# Patient Record
Sex: Female | Born: 1960
Health system: Southern US, Community
[De-identification: ages and names within clinical notes are randomized; demographics above are authoritative.]

## PROBLEM LIST (undated history)

## (undated) DIAGNOSIS — T7840XA Allergy, unspecified, initial encounter: Secondary | ICD-10-CM

## (undated) DIAGNOSIS — I8390 Asymptomatic varicose veins of unspecified lower extremity: Secondary | ICD-10-CM

## (undated) HISTORY — DX: Asymptomatic varicose veins of unspecified lower extremity: I83.90

## (undated) HISTORY — DX: Allergy, unspecified, initial encounter: T78.40XA

---

## 1998-08-25 ENCOUNTER — Other Ambulatory Visit: Admission: RE | Admit: 1998-08-25 | Discharge: 1998-08-25 | Payer: Self-pay | Admitting: Obstetrics & Gynecology

## 1999-09-05 ENCOUNTER — Other Ambulatory Visit: Admission: RE | Admit: 1999-09-05 | Discharge: 1999-09-05 | Payer: Self-pay | Admitting: Gynecology

## 2000-05-15 ENCOUNTER — Other Ambulatory Visit: Admission: RE | Admit: 2000-05-15 | Discharge: 2000-05-15 | Payer: Self-pay | Admitting: Obstetrics & Gynecology

## 2000-10-25 ENCOUNTER — Inpatient Hospital Stay (HOSPITAL_COMMUNITY): Admission: AD | Admit: 2000-10-25 | Discharge: 2000-10-25 | Payer: Self-pay | Admitting: Obstetrics & Gynecology

## 2000-11-28 ENCOUNTER — Inpatient Hospital Stay (HOSPITAL_COMMUNITY): Admission: AD | Admit: 2000-11-28 | Discharge: 2000-12-01 | Payer: Self-pay | Admitting: Obstetrics & Gynecology

## 2001-01-16 ENCOUNTER — Other Ambulatory Visit: Admission: RE | Admit: 2001-01-16 | Discharge: 2001-01-16 | Payer: Self-pay | Admitting: Obstetrics & Gynecology

## 2002-02-26 ENCOUNTER — Other Ambulatory Visit: Admission: RE | Admit: 2002-02-26 | Discharge: 2002-02-26 | Payer: Self-pay | Admitting: Obstetrics & Gynecology

## 2003-04-08 ENCOUNTER — Other Ambulatory Visit: Admission: RE | Admit: 2003-04-08 | Discharge: 2003-04-08 | Payer: Self-pay | Admitting: Obstetrics & Gynecology

## 2003-07-11 HISTORY — PX: HERNIA REPAIR: SHX51

## 2003-07-11 HISTORY — PX: BREAST LUMPECTOMY: SHX2

## 2004-05-04 ENCOUNTER — Other Ambulatory Visit: Admission: RE | Admit: 2004-05-04 | Discharge: 2004-05-04 | Payer: Self-pay | Admitting: Obstetrics & Gynecology

## 2004-12-14 ENCOUNTER — Ambulatory Visit (HOSPITAL_COMMUNITY): Admission: RE | Admit: 2004-12-14 | Discharge: 2004-12-14 | Payer: Self-pay

## 2004-12-14 ENCOUNTER — Ambulatory Visit (HOSPITAL_BASED_OUTPATIENT_CLINIC_OR_DEPARTMENT_OTHER): Admission: RE | Admit: 2004-12-14 | Discharge: 2004-12-14 | Payer: Self-pay

## 2005-07-06 ENCOUNTER — Other Ambulatory Visit: Admission: RE | Admit: 2005-07-06 | Discharge: 2005-07-06 | Payer: Self-pay | Admitting: Obstetrics & Gynecology

## 2006-01-16 HISTORY — PX: ENDOVENOUS ABLATION SAPHENOUS VEIN W/ LASER: SUR449

## 2007-06-05 ENCOUNTER — Encounter: Admission: RE | Admit: 2007-06-05 | Discharge: 2007-06-05 | Payer: Self-pay | Admitting: Obstetrics & Gynecology

## 2007-06-25 ENCOUNTER — Ambulatory Visit (HOSPITAL_BASED_OUTPATIENT_CLINIC_OR_DEPARTMENT_OTHER): Admission: RE | Admit: 2007-06-25 | Discharge: 2007-06-25 | Payer: Self-pay | Admitting: Surgery

## 2007-06-25 ENCOUNTER — Encounter (INDEPENDENT_AMBULATORY_CARE_PROVIDER_SITE_OTHER): Payer: Self-pay | Admitting: Surgery

## 2007-08-08 ENCOUNTER — Ambulatory Visit: Payer: Self-pay | Admitting: Vascular Surgery

## 2007-08-22 ENCOUNTER — Ambulatory Visit: Payer: Self-pay | Admitting: Vascular Surgery

## 2008-06-09 ENCOUNTER — Encounter: Admission: RE | Admit: 2008-06-09 | Discharge: 2008-06-09 | Payer: Self-pay | Admitting: Obstetrics & Gynecology

## 2009-05-11 ENCOUNTER — Encounter: Admission: RE | Admit: 2009-05-11 | Discharge: 2009-05-11 | Payer: Self-pay | Admitting: Obstetrics & Gynecology

## 2009-05-11 ENCOUNTER — Encounter (INDEPENDENT_AMBULATORY_CARE_PROVIDER_SITE_OTHER): Payer: Self-pay | Admitting: Obstetrics & Gynecology

## 2009-06-11 ENCOUNTER — Encounter: Admission: RE | Admit: 2009-06-11 | Discharge: 2009-06-11 | Payer: Self-pay | Admitting: Obstetrics & Gynecology

## 2009-10-04 ENCOUNTER — Ambulatory Visit: Payer: Self-pay | Admitting: Vascular Surgery

## 2010-07-20 ENCOUNTER — Encounter
Admission: RE | Admit: 2010-07-20 | Discharge: 2010-07-20 | Payer: Self-pay | Source: Home / Self Care | Attending: Obstetrics & Gynecology | Admitting: Obstetrics & Gynecology

## 2010-09-29 ENCOUNTER — Ambulatory Visit: Payer: Self-pay

## 2010-11-22 NOTE — Op Note (Signed)
Erica Ray, Erica Ray                 ACCOUNT NO.:  [id number]   MEDICAL RECORD NO.:  [id number]          PATIENT TYPE:  AMB   LOCATION:  DSC                          FACILITY:  [facility]   PHYSICIAN:  Currie Paris, M.D.DATE OF BIRTH:  1960-09-12   DATE OF PROCEDURE:  06/25/2007  DATE OF DISCHARGE:                               OPERATIVE REPORT   OFFICE MEDICAL RECORD NUMBER:  [id number].   PREOPERATIVE DIAGNOSIS:  Left breast mass.   POSTOPERATIVE DIAGNOSIS:  Left breast mass.   OPERATION:  Excisional biopsy left breast mass.   SURGEON:  Currie Paris, M.D.   ANESTHESIA:  MAC.   CLINICAL HISTORY:  This is a 50 year old lady with a palpable  abnormality in left breast midlateral position, about a 3 cm area.  Mammogram ultrasound was unremarkable, but this was clearly a palpable  abnormality compared to anyplace else in her breasts, although she has  very dense breasts.  Clinically, it was felt to represent benign  fibrocystic-type changes, but the patient requested a surgical biopsy  because of the fact that she could palpate something, and we agreed, as  it was by physical somewhat abnormal.  In the interval since I saw her  in the office and today, the mass remained the same location, although  she thought it was a little bit more rounded and less oblong than when I  had seen her a couple of weeks ago.   DESCRIPTION OF PROCEDURE:  The patient was seen in the holding area, and  she had no further questions.  We confirmed that the left side was the  operative side, and I initialed the left breast area.   The patient was taken into the operating room, and prior to being given  any IV sedation she and I identified the mass, and it was marked.  It  was clearly a rounded area of very dense breast tissue just lateral to  the edge of the areolar margin at the 3 o'clock position.  Again, it  felt to me to be a particularly dense area of fibrocystic change.   The patient was  then given IV sedation, and the left breast was prepped  and draped.  The time-out was performed.   A combination of 1% Xylocaine with epinephrine and 0.5% plain Marcaine  was mixed equally and used for local.  I infiltrated the area around the  mass and the overlying skin.  I made a transverse incision directly over  the area.  I divided the subcutaneous tissue until I could see breast  tissue and palpate this area that felt abnormal.  I put a 3-0 Vicryl  suture through this and used this for traction.  I then used cutting  current of the cautery to excise this area of tissue, and it all  appeared to be very dense breast tissue, but clearly more nodular within  the tissue I took out, with what felt like some more typical fibrotic  tissue around the more dense area.   Once this was out, I carefully palpated the edges of the biopsy cavity  to be sure there was no other residual palpable abnormality, and  everything appeared to be just dense breast tissue.  I also made sure  everything was completely dry  using cautery.  I then closed the breast in layers with some 3-0 Vicryl  followed by some 4-0 Monocryl subcuticular and Dermabond.   The patient tolerated the procedure well, and there were no  complications.  All counts were correct.      Currie Paris, M.D.  Electronically Signed     CJS/MEDQ  D:  06/25/2007  T:  06/26/2007  Job:  [id number]   cc:   Freddy Finner, M.D.

## 2010-11-25 NOTE — Op Note (Signed)
Erica Ray, Erica Ray                 ACCOUNT NO.:  [id number]   MEDICAL RECORD NO.:  [id number]          PATIENT TYPE:  AMB   LOCATION:  [facility]                         FACILITY:  [facility]   PHYSICIAN:  Lorre Munroe., M.D.DATE OF BIRTH:  06-11-1961   DATE OF PROCEDURE:  12/14/2004  DATE OF DISCHARGE:                                 OPERATIVE REPORT   PREOPERATIVE DIAGNOSES:  1.  Indirect right inguinal hernia.  2.  Varicosities of the right long saphenous vein.   OPERATION:  1.  Repair of right inguinal hernia.  2.  Ligation of the long saphenous vein and tributaries at the      saphenofemoral junction.   SURGEON:  Lebron Conners, M.D.   ANESTHESIA:  General and local.   DESCRIPTION OF PROCEDURE:  After the patient was monitored and had general  anesthesia and routine preparation and draping of the right groin region, I  made a short oblique incision beginning at the pubic tubercle and going  laterally right over the spot where the hernia was palpable when she stood.  I dissected down through subcutaneous tissues and divided several small  venous tributaries heading toward the saphenofemoral junction. I then  identified the superficial inguinal ring, which was very small but I saw  some slightly expanded tissue protruding through it. I opened the  aponeurosis of the external oblique into the superficial ring and dissected  up the round ligament. There was an obvious hernia within the round  ligament. I suture ligated the round ligament with 3-0 Vicryl at the pubic  tubercle and then dissected it up to the deep ring. I also ligated it  proximally. I reduced the hernia and round ligament through the deep  inguinal ring and plugged the ring with a small plug of polypropylene mesh  and sutured that in with 2-0 Vicryl stitch. I then made a patch of  polypropylene mesh to cover the inguinal floor from the pubic tubercle to  healthy tissues lateral to the deep ring and I sewed that in  with running 2-  0 Prolene suture in the superficial fascia of the internal oblique medially  and in the shelving edge of the inguinal ligament laterally and inferiorly.  I felt the hernia was securely repaired. I sewed the external oblique back  together with running 3-0 Vicryl. I then dissected the downward toward the  saphenofemoral junction taking the dissection just medial to the femoral  arterial pulse. I found several large tributaries of the long saphenous vein  at the saphenofemoral junction and I ligated all of those with 3-0 Vicryl. I  dissected out the saphenofemoral junction and saw the long saphenous vein  going deep into the fossa ovalis. I divided it at that point and suture  ligated it toward the femoral vein with 3-0 Vicryl and then dissected it  distally and passed a couple more small tributaries which I ligated with 3-0  Vicryl and  then I ligated it distally with 3-0 Vicryl. The proximal part of the long  saphenous vein was quite varicose. It looked more normal distally.  Hemostasis was good. I restored the subcutaneous tissues with 3-0 Vicryl and  closed the skin with intracuticular 4-0 Vicryl and Steri-Strips.       WB/MEDQ  D:  12/14/2004  T:  12/14/2004  Job:  [id number]   cc:   Valetta Mole. Cato Mulligan, M.D. [facility]   Freddy Finner, M.D.  Fax: [phone]   Jene Every, M.D.  [address]  [place]  [place] [address]  Fax: [phone]

## 2011-04-14 LAB — POCT HEMOGLOBIN-HEMACUE: Hemoglobin: 13.7

## 2011-07-26 ENCOUNTER — Ambulatory Visit (INDEPENDENT_AMBULATORY_CARE_PROVIDER_SITE_OTHER): Payer: Self-pay | Admitting: *Deleted

## 2011-07-26 ENCOUNTER — Encounter: Payer: Self-pay | Admitting: *Deleted

## 2011-07-26 DIAGNOSIS — I781 Nevus, non-neoplastic: Secondary | ICD-10-CM

## 2011-07-26 NOTE — Progress Notes (Signed)
X=.3% Sotradecol administered with a 27g butterfly.  Patient received a total of 6cc foam.  Her legs are in great shape but treated the areas of concern. Hoping for results she will be pleased with. Will follow prn.  Photos: yes  Compression stockings applied: yes

## 2011-07-27 ENCOUNTER — Encounter: Payer: Self-pay | Admitting: Vascular Surgery

## 2011-10-04 ENCOUNTER — Ambulatory Visit: Payer: Self-pay | Admitting: *Deleted

## 2013-06-24 ENCOUNTER — Other Ambulatory Visit: Payer: Self-pay | Admitting: Obstetrics and Gynecology

## 2013-06-24 DIAGNOSIS — Z1231 Encounter for screening mammogram for malignant neoplasm of breast: Secondary | ICD-10-CM

## 2013-07-07 ENCOUNTER — Ambulatory Visit
Admission: RE | Admit: 2013-07-07 | Discharge: 2013-07-07 | Disposition: A | Discharge status: Home / Self Care | Payer: No Typology Code available for payment source | Source: Ambulatory Visit | Attending: Obstetrics and Gynecology | Admitting: Obstetrics and Gynecology

## 2013-07-07 DIAGNOSIS — Z1231 Encounter for screening mammogram for malignant neoplasm of breast: Secondary | ICD-10-CM

## 2013-08-25 ENCOUNTER — Encounter: Payer: Self-pay | Admitting: *Deleted

## 2013-08-27 ENCOUNTER — Ambulatory Visit: Payer: No Typology Code available for payment source | Admitting: *Deleted

## 2013-08-28 ENCOUNTER — Encounter: Payer: Self-pay | Admitting: *Deleted

## 2013-08-29 ENCOUNTER — Encounter: Payer: Self-pay | Admitting: Vascular Surgery

## 2013-08-29 ENCOUNTER — Ambulatory Visit (INDEPENDENT_AMBULATORY_CARE_PROVIDER_SITE_OTHER): Payer: No Typology Code available for payment source | Admitting: *Deleted

## 2013-08-29 DIAGNOSIS — I781 Nevus, non-neoplastic: Secondary | ICD-10-CM

## 2013-08-29 NOTE — Progress Notes (Signed)
X=.3% Sotradecol administered with a 27g butterfly.  Patient received a total of 10cc.  Her legs have come a long way. She wants to stay on top of her veins so injected some bulges and some spiders. CL in a few months would be good to get the tiny red vessels. Tol well. Follow prn.  Photos: no  Compression stockings applied: yes

## 2013-09-02 ENCOUNTER — Ambulatory Visit (INDEPENDENT_AMBULATORY_CARE_PROVIDER_SITE_OTHER): Payer: No Typology Code available for payment source | Admitting: *Deleted

## 2013-09-02 DIAGNOSIS — I781 Nevus, non-neoplastic: Secondary | ICD-10-CM

## 2013-09-02 NOTE — Progress Notes (Signed)
Patient was here last week and I forgot to lase a spot on her cheek. Also checked on a vein in her right inner thigh. Pt being compliant with her stockings. Following prn.

## 2013-09-25 ENCOUNTER — Encounter: Payer: Self-pay | Admitting: *Deleted

## 2013-09-26 ENCOUNTER — Ambulatory Visit (INDEPENDENT_AMBULATORY_CARE_PROVIDER_SITE_OTHER): Payer: Self-pay | Admitting: *Deleted

## 2013-09-26 DIAGNOSIS — I781 Nevus, non-neoplastic: Secondary | ICD-10-CM

## 2013-09-26 DIAGNOSIS — I83893 Varicose veins of bilateral lower extremities with other complications: Secondary | ICD-10-CM

## 2013-09-26 NOTE — Progress Notes (Signed)
The patient came in today for me to look at the veins in particular on the back of her right calf but also on her left calf in the back. She complains of heaviness in both legs as the day goes on. She has had previous laser of the R GSV with phlebectomies by Dr. Arbie CookeyEarly. She has been wearing thigh high graduated compression stockings since her laser sclero treatment on 08/29/13 so that is when we started her three months of conservative therapy. She does elevate at night when she is able and tries Ibuprofen for pain prn. She is an active mother of 4 children and on her feet all day. The heaviness, pain and bulging worsens as the day goes on. Dr. Imogene Burnhen saw her today briefly and ordered a bilateral reflux study and new vv MD visit asap.

## 2013-09-26 NOTE — Addendum Note (Signed)
Addended by: Adria DillELDRIDGE-LEWIS, Brunette Lavalle L on: 09/26/2013 07:29 PM   Modules accepted: Orders

## 2013-11-14 ENCOUNTER — Encounter: Payer: Self-pay | Admitting: Surgery

## 2013-11-17 ENCOUNTER — Ambulatory Visit (HOSPITAL_COMMUNITY)
Admission: RE | Admit: 2013-11-17 | Discharge: 2013-11-17 | Disposition: A | Discharge status: Home / Self Care | Payer: No Typology Code available for payment source | Source: Ambulatory Visit | Attending: Vascular Surgery | Admitting: Vascular Surgery

## 2013-11-17 ENCOUNTER — Ambulatory Visit (INDEPENDENT_AMBULATORY_CARE_PROVIDER_SITE_OTHER): Payer: No Typology Code available for payment source | Admitting: Surgery

## 2013-11-17 ENCOUNTER — Encounter: Payer: Self-pay | Admitting: Surgery

## 2013-11-17 VITALS — BP 139/70 | HR 67 | Resp 18 | Ht 64.0 in | Wt 111.7 lb

## 2013-11-17 DIAGNOSIS — I83893 Varicose veins of bilateral lower extremities with other complications: Secondary | ICD-10-CM | POA: Insufficient documentation

## 2013-11-17 NOTE — Progress Notes (Signed)
Patient name: Erica PieriniSusan M Ray MRN: [id number] DOB: 27-Jun-1961 Sex: female     Chief Complaint  Patient presents with  . New Evaluation    c/o bilateral leg achiness and heaviness R>L, worse with prolonged standing     . Varicose Veins    HISTORY OF PRESENT ILLNESS: The patient is back today with further concerns regarding her bilateral lower extremities.  She has a history of undergoing endovenous laser closure of the right great saphenous vein and stab phlebectomy of secondary varicosities by Dr. early in 2007.  Prior to that she had also undergone tape procedure at the pain clinic.  She reports relatively new symptoms of heaviness and burning in both legs.  The right leg bothers her more so than the left.  She does have occasional swelling.  Her symptoms are alleviated with leg elevation.  She does wear compression stockings.  She has also undergone several sessions of sclerotherapy for symptomatic spider veins.  Past Medical History  Diagnosis Date  . Varicose veins   . Allergy     Past Surgical History  Procedure Laterality Date  . Endovenous ablation saphenous vein w/ laser Right 01-16-2006    Gretta Beganodd Early MD  . Cesarean section    . Breast lumpectomy Left 2005  . Hernia repair  2005    History   Social History  . Marital Status: Married    Spouse Name: N/A    Number of Children: N/A  . Years of Education: N/A   Occupational History  . Not on file.   Social History Main Topics  . Smoking status: Never Smoker   . Smokeless tobacco: Not on file  . Alcohol Use: No  . Drug Use: No  . Sexual Activity: Not on file   Other Topics Concern  . Not on file   Social History Narrative  . No narrative on file    History reviewed. No pertinent family history.  Allergies as of 11/17/2013 - Review Complete 11/17/2013  Allergen Reaction Noted  . Bactrim [sulfamethoxazole-tmp ds]  11/17/2013  . Penicillins  11/17/2013    No current outpatient prescriptions on file  prior to visit.   No current facility-administered medications on file prior to visit.     REVIEW OF SYSTEMS: Please see history of present illness, otherwise all systems negative  PHYSICAL EXAMINATION:   Vital signs are BP 139/70  Pulse 67  Resp 18  Ht 5\' 4"  (1.626 m)  Wt 111 lb 11.2 oz (50.667 kg)  BMI 19.16 kg/m2 General: The patient appears their stated age. HEENT:  No gross abnormalities Pulmonary:  Non labored breathing Musculoskeletal: There are no major deformities. Neurologic: No focal weakness or paresthesias are detected, Skin: There are no ulcer or rashes noted. Psychiatric: The patient has normal affect. Cardiovascular: Palpable pedal pulses.  Trace edema bilaterally.  Spider veins bilaterally   Diagnostic Studies I have ordered and reviewed her ultrasound.  There is deep vein reflux on the right.  There is a segment of the great saphenous vein with significant reflux.  Diameter measurements are between 0.2 and a 0.3 cm.  There is also significant reflux in the small saphenous vein with maximum diameter of 0.46 cm in the mid calf.  Multiple tributaries are appreciated around the small saphenous vein.  Assessment: Symptomatic venous insufficiency, bilateral Plan: I discussed with the patient that I feel most of her symptoms on the left leg are secondary to deep vein reflux.  No significant superficial system  reflux was noted.  I feel this would be best treated by compression stockings.  On the right leg however, she has residual reflux within the great saphenous vein with rather small diameters.  The small saphenous vein has significant reflux and refluxing tributaries.  I do feel she would be a candidate for small saphenous vein laser ablation and possible treatment of associated varicosities.  I will place the patient in 20-30 mm thigh-high compression stockings and have her come back for a repeat evaluation by Dr. early in 3 months  V. Charlena CrossWells Cleatus Gabriel IV, M.D. [facility] Office: [phone] Pager:  [phone]

## 2014-01-21 ENCOUNTER — Other Ambulatory Visit: Payer: Self-pay | Admitting: Gastroenterology

## 2014-02-16 ENCOUNTER — Encounter: Payer: Self-pay | Admitting: Vascular Surgery

## 2014-02-17 ENCOUNTER — Encounter: Payer: Self-pay | Admitting: Vascular Surgery

## 2014-02-17 ENCOUNTER — Ambulatory Visit (INDEPENDENT_AMBULATORY_CARE_PROVIDER_SITE_OTHER): Payer: No Typology Code available for payment source | Admitting: Vascular Surgery

## 2014-02-17 VITALS — BP 110/49 | HR 80 | Resp 18 | Ht 64.0 in | Wt 116.0 lb

## 2014-02-17 DIAGNOSIS — I83893 Varicose veins of bilateral lower extremities with other complications: Secondary | ICD-10-CM

## 2014-02-17 NOTE — Progress Notes (Signed)
Problems with Activities of Daily Living Secondary to Leg Pain  1. Mrs. Erica Ray states that activities that require prolonged standing (cooking, cleaning, shopping)  are very difficult due to leg pain.    2. Mrs. Erica Ray states when traveling (car or plane) is very difficult due to leg pain.    3. Mrs.  Erica Ray has had to discontinue running (for exercise) due to leg pain.     Failure of  Conservative Therapy:  1. Worn 20-30 mm Hg thigh high compression hose >3 months with no relief of symptoms.  2. Frequently elevates legs-no relief of symptoms  3. Taken Ibuprofen 600 Mg TID with no relief of symptoms.  Here today for continued discussion regarding her venous pathology. She's had good results from her prior treatment. She does have some scattered small varicose and reticular veins in her posterior calf was probably on the right. She does report some discomfort and burning over this area. I reviewed her most recent duplex from 11/17/2013 and also image these areas with SonoSite myself. Her small saphenous vein on the right does have some reflux but is not particularly dilated. Maximal diameter being 0.46 cm. She does have very large dilated popliteal and gastrocnemius veins and gross reflux throughout her deep system bilaterally.  A long discussion with the patient. I feel that her discomfort is related to deep venous hypertension. Ex-preemie only treatment option would be continued elevation and compression for deep venous reflux. She may receive some relief with the use of superficial reticular veins and small varicosities with the sclerotherapy. She will schedule this at her convenience in our office

## 2014-07-14 ENCOUNTER — Encounter: Payer: Self-pay | Admitting: *Deleted

## 2014-07-15 ENCOUNTER — Ambulatory Visit (INDEPENDENT_AMBULATORY_CARE_PROVIDER_SITE_OTHER): Payer: No Typology Code available for payment source | Admitting: *Deleted

## 2014-07-15 DIAGNOSIS — I781 Nevus, non-neoplastic: Secondary | ICD-10-CM

## 2014-07-15 NOTE — Progress Notes (Signed)
X=.3% Sotradecol administered with a 27g butterfly.  Patient received a total of 12cc.  Treated all areas of concern. Tol well and very easy access. Anticipate good results. Follow prn.  Photos: No.  Compression stockings applied: Yes.  and an ace around foot ankle of left leg.

## 2014-07-16 ENCOUNTER — Other Ambulatory Visit: Payer: Self-pay | Admitting: Obstetrics and Gynecology

## 2014-07-17 ENCOUNTER — Encounter: Payer: Self-pay | Admitting: *Deleted

## 2014-07-17 LAB — CYTOLOGY - PAP

## 2014-07-28 ENCOUNTER — Ambulatory Visit (INDEPENDENT_AMBULATORY_CARE_PROVIDER_SITE_OTHER): Payer: No Typology Code available for payment source | Admitting: *Deleted

## 2014-07-28 DIAGNOSIS — I781 Nevus, non-neoplastic: Secondary | ICD-10-CM

## 2014-07-28 NOTE — Progress Notes (Signed)
Sclero check: It will be two weeks since her treatment. She wanted to know if she needed more injections. I feel it's too soon to determine if the sclero didn't work. Area of concern feels firm therefore hopefully will disappear. I suggested that she give this more time to resolve. She agreed. Follow prn.

## 2015-03-20 IMAGING — MG STANDARD SCREENING - COMBO
8 series · 9 of 24 positions shown · non-contrast
Comparison: Previous exam(s).

CLINICAL DATA: Screening.

EXAM:
DIGITAL SCREENING BILATERAL MAMMOGRAM WITH CAD
DIGITAL BREAST TOMOSYNTHESIS
Digital breast tomosynthesis images are acquired in two projections.
These images are reviewed in combination with the digital mammogram,
confirming the findings below.

[L CC]
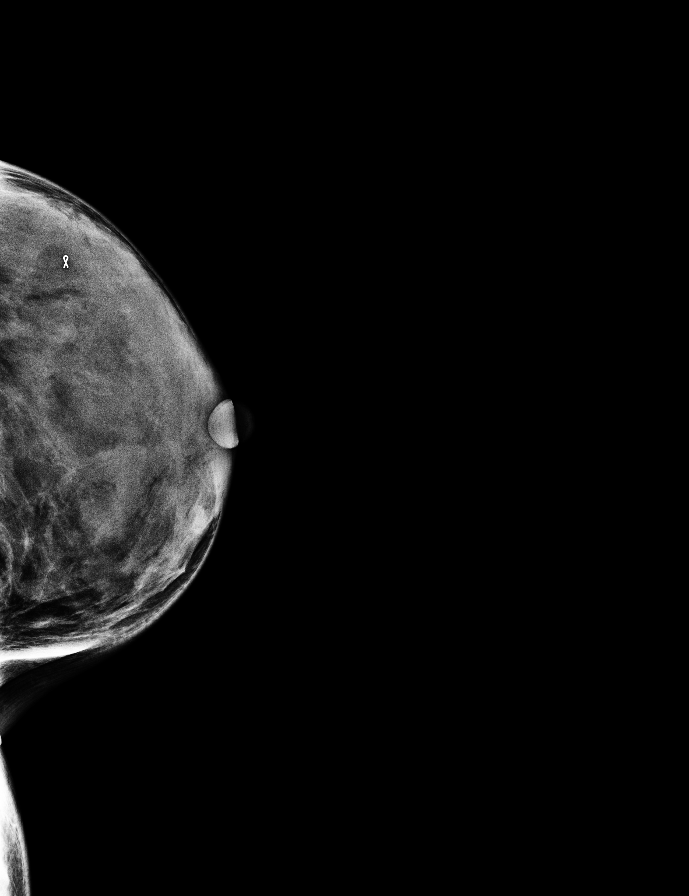

[R CC]
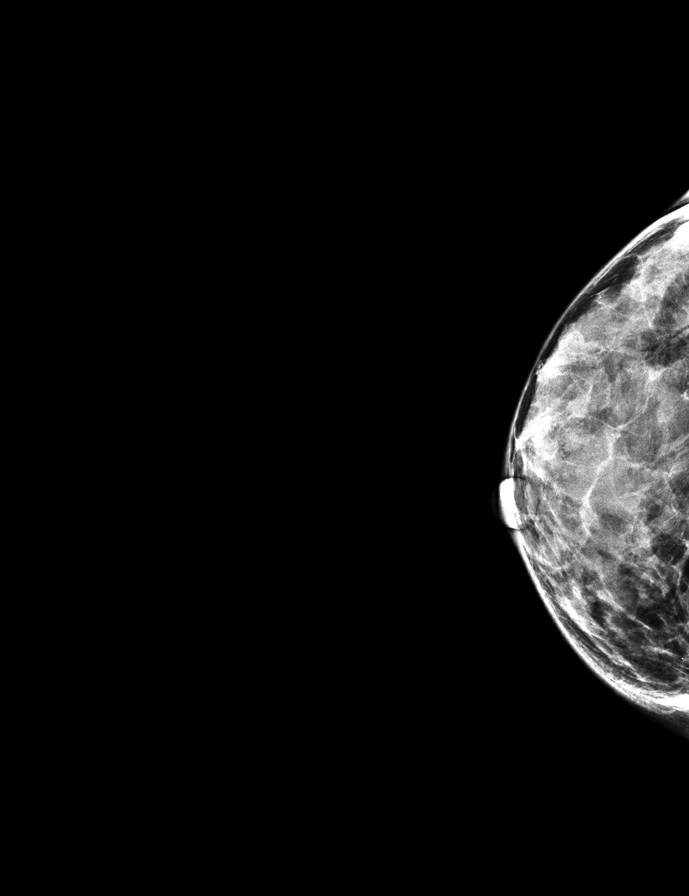

[L MLO]
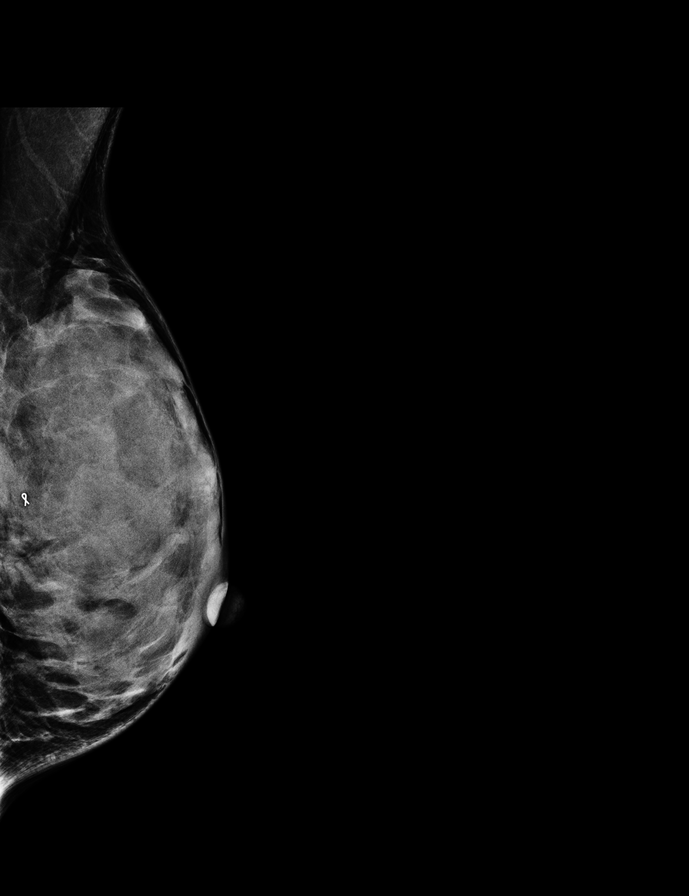

[R MLO]
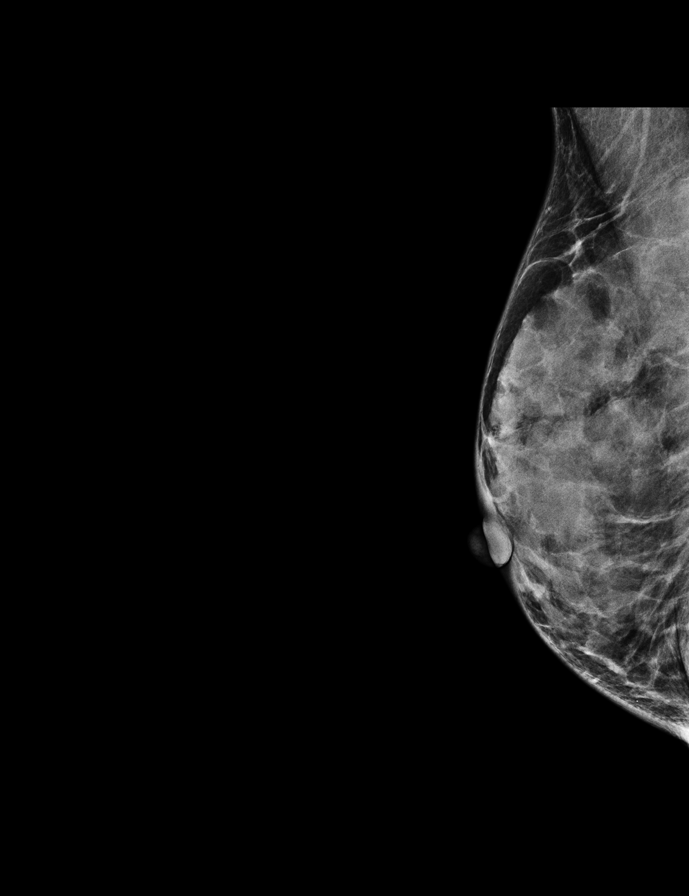

[L CC tomo · 2 of 39 frames shown]
[frame 13/39]
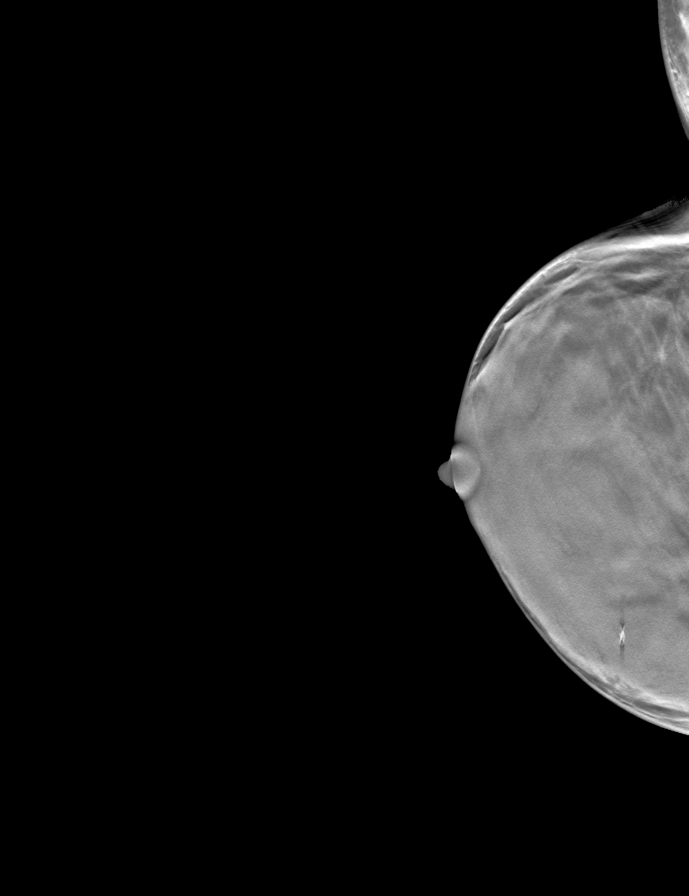
[frame 20/39]
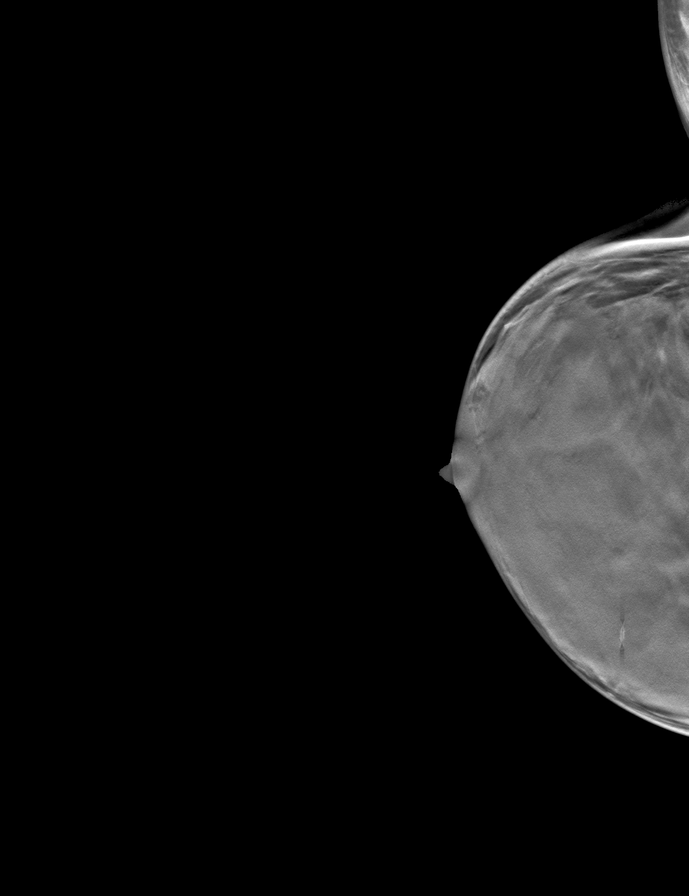

[R MLO tomo · tomo slice 17/33.0]
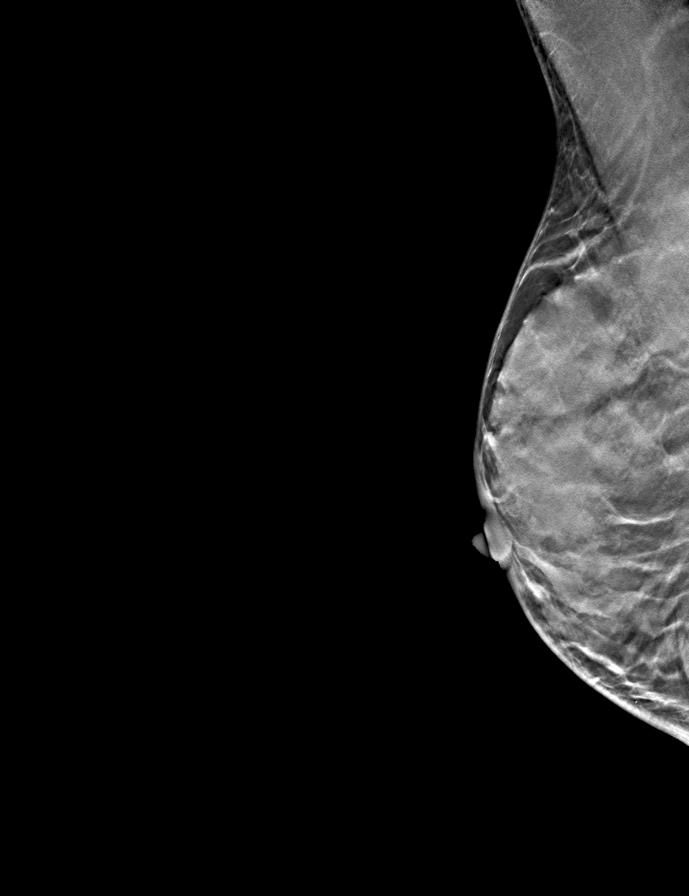

[R CC tomo · tomo slice 18/35.0]
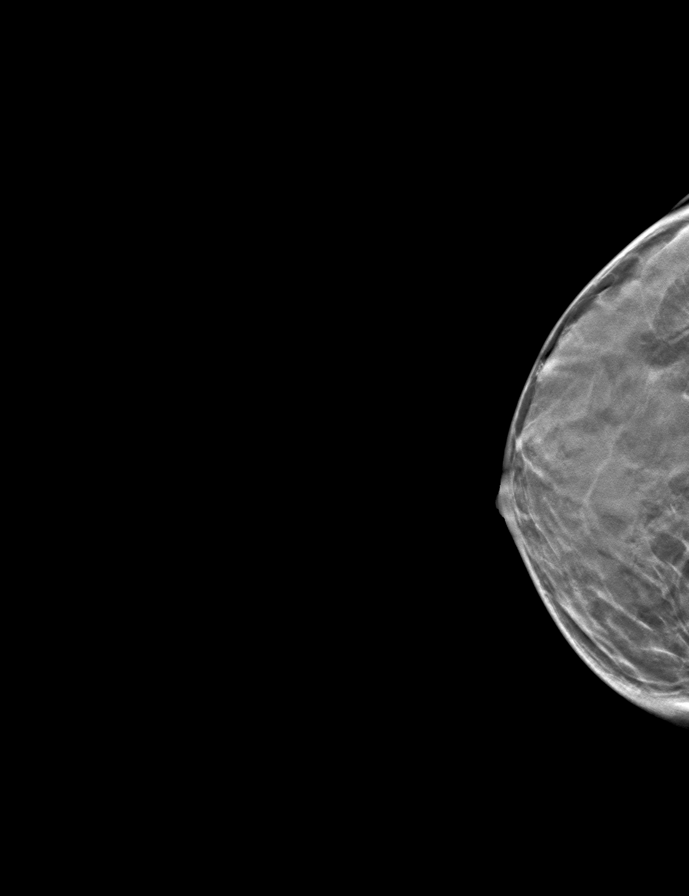

[L MLO tomo · tomo slice 21/41.0]
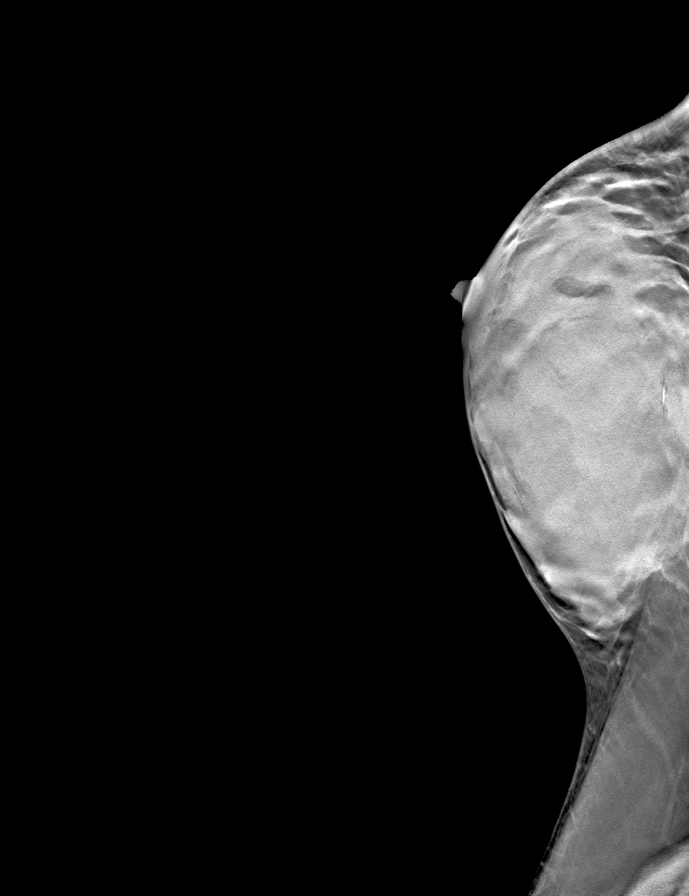

[9 of 24 positions shown; findings below may reference images not displayed]

ACR Breast Density Category d: The breasts are extremely dense,
which lowers the sensitivity of mammography.
FINDINGS: There are no findings suspicious for malignancy. Images were
processed with CAD.
IMPRESSION: No mammographic evidence of malignancy. A result letter of this
screening mammogram will be mailed directly to the patient.

RECOMMENDATION:
Screening mammogram in one year. (Code:7L-N-[id number])

BI-RADS CATEGORY  1: Negative

## 2017-07-18 DIAGNOSIS — J3089 Other allergic rhinitis: Secondary | ICD-10-CM | POA: Diagnosis not present

## 2017-07-18 DIAGNOSIS — J3081 Allergic rhinitis due to animal (cat) (dog) hair and dander: Secondary | ICD-10-CM | POA: Diagnosis not present

## 2017-07-18 DIAGNOSIS — J301 Allergic rhinitis due to pollen: Secondary | ICD-10-CM | POA: Diagnosis not present

## 2017-07-20 DIAGNOSIS — J3081 Allergic rhinitis due to animal (cat) (dog) hair and dander: Secondary | ICD-10-CM | POA: Diagnosis not present

## 2017-07-20 DIAGNOSIS — J3089 Other allergic rhinitis: Secondary | ICD-10-CM | POA: Diagnosis not present

## 2017-07-20 DIAGNOSIS — J301 Allergic rhinitis due to pollen: Secondary | ICD-10-CM | POA: Diagnosis not present

## 2017-07-23 DIAGNOSIS — J3081 Allergic rhinitis due to animal (cat) (dog) hair and dander: Secondary | ICD-10-CM | POA: Diagnosis not present

## 2017-07-23 DIAGNOSIS — J301 Allergic rhinitis due to pollen: Secondary | ICD-10-CM | POA: Diagnosis not present

## 2017-07-23 DIAGNOSIS — D72819 Decreased white blood cell count, unspecified: Secondary | ICD-10-CM | POA: Diagnosis not present

## 2017-07-23 DIAGNOSIS — N926 Irregular menstruation, unspecified: Secondary | ICD-10-CM | POA: Diagnosis not present

## 2017-07-23 DIAGNOSIS — Z0000 Encounter for general adult medical examination without abnormal findings: Secondary | ICD-10-CM | POA: Diagnosis not present

## 2017-07-23 DIAGNOSIS — Z91018 Allergy to other foods: Secondary | ICD-10-CM | POA: Diagnosis not present

## 2017-07-23 DIAGNOSIS — Z13220 Encounter for screening for lipoid disorders: Secondary | ICD-10-CM | POA: Diagnosis not present

## 2017-07-23 DIAGNOSIS — E559 Vitamin D deficiency, unspecified: Secondary | ICD-10-CM | POA: Diagnosis not present

## 2017-07-23 DIAGNOSIS — E282 Polycystic ovarian syndrome: Secondary | ICD-10-CM | POA: Diagnosis not present

## 2017-07-23 DIAGNOSIS — J3089 Other allergic rhinitis: Secondary | ICD-10-CM | POA: Diagnosis not present

## 2017-07-25 DIAGNOSIS — J3089 Other allergic rhinitis: Secondary | ICD-10-CM | POA: Diagnosis not present

## 2017-07-25 DIAGNOSIS — J3081 Allergic rhinitis due to animal (cat) (dog) hair and dander: Secondary | ICD-10-CM | POA: Diagnosis not present

## 2017-07-25 DIAGNOSIS — J301 Allergic rhinitis due to pollen: Secondary | ICD-10-CM | POA: Diagnosis not present

## 2017-07-27 DIAGNOSIS — J3089 Other allergic rhinitis: Secondary | ICD-10-CM | POA: Diagnosis not present

## 2017-07-27 DIAGNOSIS — J301 Allergic rhinitis due to pollen: Secondary | ICD-10-CM | POA: Diagnosis not present

## 2017-07-27 DIAGNOSIS — J3081 Allergic rhinitis due to animal (cat) (dog) hair and dander: Secondary | ICD-10-CM | POA: Diagnosis not present

## 2017-07-31 DIAGNOSIS — J3081 Allergic rhinitis due to animal (cat) (dog) hair and dander: Secondary | ICD-10-CM | POA: Diagnosis not present

## 2017-07-31 DIAGNOSIS — J3089 Other allergic rhinitis: Secondary | ICD-10-CM | POA: Diagnosis not present

## 2017-07-31 DIAGNOSIS — J301 Allergic rhinitis due to pollen: Secondary | ICD-10-CM | POA: Diagnosis not present

## 2017-08-02 DIAGNOSIS — J3081 Allergic rhinitis due to animal (cat) (dog) hair and dander: Secondary | ICD-10-CM | POA: Diagnosis not present

## 2017-08-02 DIAGNOSIS — J301 Allergic rhinitis due to pollen: Secondary | ICD-10-CM | POA: Diagnosis not present

## 2017-08-02 DIAGNOSIS — J3089 Other allergic rhinitis: Secondary | ICD-10-CM | POA: Diagnosis not present

## 2017-08-08 DIAGNOSIS — J3081 Allergic rhinitis due to animal (cat) (dog) hair and dander: Secondary | ICD-10-CM | POA: Diagnosis not present

## 2017-08-08 DIAGNOSIS — J301 Allergic rhinitis due to pollen: Secondary | ICD-10-CM | POA: Diagnosis not present

## 2017-08-08 DIAGNOSIS — J3089 Other allergic rhinitis: Secondary | ICD-10-CM | POA: Diagnosis not present

## 2017-08-10 DIAGNOSIS — J3081 Allergic rhinitis due to animal (cat) (dog) hair and dander: Secondary | ICD-10-CM | POA: Diagnosis not present

## 2017-08-10 DIAGNOSIS — J301 Allergic rhinitis due to pollen: Secondary | ICD-10-CM | POA: Diagnosis not present

## 2017-08-10 DIAGNOSIS — J3089 Other allergic rhinitis: Secondary | ICD-10-CM | POA: Diagnosis not present

## 2017-08-14 DIAGNOSIS — J301 Allergic rhinitis due to pollen: Secondary | ICD-10-CM | POA: Diagnosis not present

## 2017-08-14 DIAGNOSIS — J3081 Allergic rhinitis due to animal (cat) (dog) hair and dander: Secondary | ICD-10-CM | POA: Diagnosis not present

## 2017-08-14 DIAGNOSIS — J3089 Other allergic rhinitis: Secondary | ICD-10-CM | POA: Diagnosis not present

## 2017-08-16 DIAGNOSIS — J3089 Other allergic rhinitis: Secondary | ICD-10-CM | POA: Diagnosis not present

## 2017-08-16 DIAGNOSIS — J301 Allergic rhinitis due to pollen: Secondary | ICD-10-CM | POA: Diagnosis not present

## 2017-08-16 DIAGNOSIS — J3081 Allergic rhinitis due to animal (cat) (dog) hair and dander: Secondary | ICD-10-CM | POA: Diagnosis not present

## 2017-08-22 DIAGNOSIS — J301 Allergic rhinitis due to pollen: Secondary | ICD-10-CM | POA: Diagnosis not present

## 2017-08-22 DIAGNOSIS — J3081 Allergic rhinitis due to animal (cat) (dog) hair and dander: Secondary | ICD-10-CM | POA: Diagnosis not present

## 2017-08-22 DIAGNOSIS — J3089 Other allergic rhinitis: Secondary | ICD-10-CM | POA: Diagnosis not present

## 2017-08-24 DIAGNOSIS — J301 Allergic rhinitis due to pollen: Secondary | ICD-10-CM | POA: Diagnosis not present

## 2017-08-24 DIAGNOSIS — J3081 Allergic rhinitis due to animal (cat) (dog) hair and dander: Secondary | ICD-10-CM | POA: Diagnosis not present

## 2017-08-24 DIAGNOSIS — J3089 Other allergic rhinitis: Secondary | ICD-10-CM | POA: Diagnosis not present

## 2017-08-29 DIAGNOSIS — J3081 Allergic rhinitis due to animal (cat) (dog) hair and dander: Secondary | ICD-10-CM | POA: Diagnosis not present

## 2017-08-29 DIAGNOSIS — J3089 Other allergic rhinitis: Secondary | ICD-10-CM | POA: Diagnosis not present

## 2017-08-29 DIAGNOSIS — J301 Allergic rhinitis due to pollen: Secondary | ICD-10-CM | POA: Diagnosis not present

## 2017-08-31 DIAGNOSIS — J301 Allergic rhinitis due to pollen: Secondary | ICD-10-CM | POA: Diagnosis not present

## 2017-08-31 DIAGNOSIS — J3081 Allergic rhinitis due to animal (cat) (dog) hair and dander: Secondary | ICD-10-CM | POA: Diagnosis not present

## 2017-08-31 DIAGNOSIS — J3089 Other allergic rhinitis: Secondary | ICD-10-CM | POA: Diagnosis not present

## 2017-09-03 DIAGNOSIS — J301 Allergic rhinitis due to pollen: Secondary | ICD-10-CM | POA: Diagnosis not present

## 2017-09-03 DIAGNOSIS — J3081 Allergic rhinitis due to animal (cat) (dog) hair and dander: Secondary | ICD-10-CM | POA: Diagnosis not present

## 2017-09-03 DIAGNOSIS — J3089 Other allergic rhinitis: Secondary | ICD-10-CM | POA: Diagnosis not present

## 2017-09-06 DIAGNOSIS — Z23 Encounter for immunization: Secondary | ICD-10-CM | POA: Diagnosis not present

## 2017-09-12 DIAGNOSIS — J301 Allergic rhinitis due to pollen: Secondary | ICD-10-CM | POA: Diagnosis not present

## 2017-09-12 DIAGNOSIS — J3089 Other allergic rhinitis: Secondary | ICD-10-CM | POA: Diagnosis not present

## 2017-09-12 DIAGNOSIS — J3081 Allergic rhinitis due to animal (cat) (dog) hair and dander: Secondary | ICD-10-CM | POA: Diagnosis not present

## 2017-09-14 DIAGNOSIS — J301 Allergic rhinitis due to pollen: Secondary | ICD-10-CM | POA: Diagnosis not present

## 2017-09-14 DIAGNOSIS — J3089 Other allergic rhinitis: Secondary | ICD-10-CM | POA: Diagnosis not present

## 2017-09-14 DIAGNOSIS — J3081 Allergic rhinitis due to animal (cat) (dog) hair and dander: Secondary | ICD-10-CM | POA: Diagnosis not present

## 2017-09-18 DIAGNOSIS — J3081 Allergic rhinitis due to animal (cat) (dog) hair and dander: Secondary | ICD-10-CM | POA: Diagnosis not present

## 2017-09-18 DIAGNOSIS — J3089 Other allergic rhinitis: Secondary | ICD-10-CM | POA: Diagnosis not present

## 2017-09-18 DIAGNOSIS — J301 Allergic rhinitis due to pollen: Secondary | ICD-10-CM | POA: Diagnosis not present

## 2017-09-21 DIAGNOSIS — J301 Allergic rhinitis due to pollen: Secondary | ICD-10-CM | POA: Diagnosis not present

## 2017-09-21 DIAGNOSIS — J3081 Allergic rhinitis due to animal (cat) (dog) hair and dander: Secondary | ICD-10-CM | POA: Diagnosis not present

## 2017-09-21 DIAGNOSIS — J3089 Other allergic rhinitis: Secondary | ICD-10-CM | POA: Diagnosis not present

## 2017-10-01 DIAGNOSIS — J3081 Allergic rhinitis due to animal (cat) (dog) hair and dander: Secondary | ICD-10-CM | POA: Diagnosis not present

## 2017-10-01 DIAGNOSIS — J301 Allergic rhinitis due to pollen: Secondary | ICD-10-CM | POA: Diagnosis not present

## 2017-10-01 DIAGNOSIS — J3089 Other allergic rhinitis: Secondary | ICD-10-CM | POA: Diagnosis not present

## 2017-10-08 DIAGNOSIS — J3081 Allergic rhinitis due to animal (cat) (dog) hair and dander: Secondary | ICD-10-CM | POA: Diagnosis not present

## 2017-10-08 DIAGNOSIS — L503 Dermatographic urticaria: Secondary | ICD-10-CM | POA: Diagnosis not present

## 2017-10-08 DIAGNOSIS — L501 Idiopathic urticaria: Secondary | ICD-10-CM | POA: Diagnosis not present

## 2017-10-08 DIAGNOSIS — J3089 Other allergic rhinitis: Secondary | ICD-10-CM | POA: Diagnosis not present

## 2017-10-08 DIAGNOSIS — J301 Allergic rhinitis due to pollen: Secondary | ICD-10-CM | POA: Diagnosis not present

## 2017-10-17 DIAGNOSIS — J3089 Other allergic rhinitis: Secondary | ICD-10-CM | POA: Diagnosis not present

## 2017-10-17 DIAGNOSIS — J3081 Allergic rhinitis due to animal (cat) (dog) hair and dander: Secondary | ICD-10-CM | POA: Diagnosis not present

## 2017-10-17 DIAGNOSIS — J301 Allergic rhinitis due to pollen: Secondary | ICD-10-CM | POA: Diagnosis not present

## 2017-10-23 DIAGNOSIS — J3089 Other allergic rhinitis: Secondary | ICD-10-CM | POA: Diagnosis not present

## 2017-10-23 DIAGNOSIS — J3081 Allergic rhinitis due to animal (cat) (dog) hair and dander: Secondary | ICD-10-CM | POA: Diagnosis not present

## 2017-10-23 DIAGNOSIS — J301 Allergic rhinitis due to pollen: Secondary | ICD-10-CM | POA: Diagnosis not present

## 2017-11-02 DIAGNOSIS — J301 Allergic rhinitis due to pollen: Secondary | ICD-10-CM | POA: Diagnosis not present

## 2017-11-02 DIAGNOSIS — J3089 Other allergic rhinitis: Secondary | ICD-10-CM | POA: Diagnosis not present

## 2017-11-02 DIAGNOSIS — J3081 Allergic rhinitis due to animal (cat) (dog) hair and dander: Secondary | ICD-10-CM | POA: Diagnosis not present

## 2017-11-05 DIAGNOSIS — J3089 Other allergic rhinitis: Secondary | ICD-10-CM | POA: Diagnosis not present

## 2017-11-14 DIAGNOSIS — J301 Allergic rhinitis due to pollen: Secondary | ICD-10-CM | POA: Diagnosis not present

## 2017-11-14 DIAGNOSIS — J3089 Other allergic rhinitis: Secondary | ICD-10-CM | POA: Diagnosis not present

## 2017-11-14 DIAGNOSIS — J3081 Allergic rhinitis due to animal (cat) (dog) hair and dander: Secondary | ICD-10-CM | POA: Diagnosis not present

## 2017-11-23 DIAGNOSIS — J3081 Allergic rhinitis due to animal (cat) (dog) hair and dander: Secondary | ICD-10-CM | POA: Diagnosis not present

## 2017-11-23 DIAGNOSIS — J3089 Other allergic rhinitis: Secondary | ICD-10-CM | POA: Diagnosis not present

## 2017-11-23 DIAGNOSIS — J301 Allergic rhinitis due to pollen: Secondary | ICD-10-CM | POA: Diagnosis not present

## 2017-11-28 DIAGNOSIS — J301 Allergic rhinitis due to pollen: Secondary | ICD-10-CM | POA: Diagnosis not present

## 2017-11-28 DIAGNOSIS — J3081 Allergic rhinitis due to animal (cat) (dog) hair and dander: Secondary | ICD-10-CM | POA: Diagnosis not present

## 2017-11-28 DIAGNOSIS — J3089 Other allergic rhinitis: Secondary | ICD-10-CM | POA: Diagnosis not present

## 2017-12-04 DIAGNOSIS — R5383 Other fatigue: Secondary | ICD-10-CM | POA: Diagnosis not present

## 2017-12-04 DIAGNOSIS — N941 Unspecified dyspareunia: Secondary | ICD-10-CM | POA: Diagnosis not present

## 2017-12-04 DIAGNOSIS — T7840XA Allergy, unspecified, initial encounter: Secondary | ICD-10-CM | POA: Diagnosis not present

## 2017-12-04 DIAGNOSIS — R32 Unspecified urinary incontinence: Secondary | ICD-10-CM | POA: Diagnosis not present

## 2017-12-06 DIAGNOSIS — J3089 Other allergic rhinitis: Secondary | ICD-10-CM | POA: Diagnosis not present

## 2017-12-06 DIAGNOSIS — J301 Allergic rhinitis due to pollen: Secondary | ICD-10-CM | POA: Diagnosis not present

## 2017-12-06 DIAGNOSIS — J3081 Allergic rhinitis due to animal (cat) (dog) hair and dander: Secondary | ICD-10-CM | POA: Diagnosis not present

## 2017-12-13 DIAGNOSIS — J301 Allergic rhinitis due to pollen: Secondary | ICD-10-CM | POA: Diagnosis not present

## 2017-12-13 DIAGNOSIS — J3089 Other allergic rhinitis: Secondary | ICD-10-CM | POA: Diagnosis not present

## 2017-12-13 DIAGNOSIS — J3081 Allergic rhinitis due to animal (cat) (dog) hair and dander: Secondary | ICD-10-CM | POA: Diagnosis not present

## 2017-12-19 DIAGNOSIS — J3081 Allergic rhinitis due to animal (cat) (dog) hair and dander: Secondary | ICD-10-CM | POA: Diagnosis not present

## 2017-12-19 DIAGNOSIS — J301 Allergic rhinitis due to pollen: Secondary | ICD-10-CM | POA: Diagnosis not present

## 2017-12-19 DIAGNOSIS — J3089 Other allergic rhinitis: Secondary | ICD-10-CM | POA: Diagnosis not present

## 2017-12-27 DIAGNOSIS — J301 Allergic rhinitis due to pollen: Secondary | ICD-10-CM | POA: Diagnosis not present

## 2017-12-27 DIAGNOSIS — J3081 Allergic rhinitis due to animal (cat) (dog) hair and dander: Secondary | ICD-10-CM | POA: Diagnosis not present

## 2017-12-27 DIAGNOSIS — J3089 Other allergic rhinitis: Secondary | ICD-10-CM | POA: Diagnosis not present

## 2018-01-03 DIAGNOSIS — J3081 Allergic rhinitis due to animal (cat) (dog) hair and dander: Secondary | ICD-10-CM | POA: Diagnosis not present

## 2018-01-03 DIAGNOSIS — J301 Allergic rhinitis due to pollen: Secondary | ICD-10-CM | POA: Diagnosis not present

## 2018-01-03 DIAGNOSIS — J3089 Other allergic rhinitis: Secondary | ICD-10-CM | POA: Diagnosis not present

## 2018-01-09 DIAGNOSIS — J3089 Other allergic rhinitis: Secondary | ICD-10-CM | POA: Diagnosis not present

## 2018-01-09 DIAGNOSIS — J3081 Allergic rhinitis due to animal (cat) (dog) hair and dander: Secondary | ICD-10-CM | POA: Diagnosis not present

## 2018-01-09 DIAGNOSIS — J301 Allergic rhinitis due to pollen: Secondary | ICD-10-CM | POA: Diagnosis not present

## 2018-01-18 DIAGNOSIS — J3089 Other allergic rhinitis: Secondary | ICD-10-CM | POA: Diagnosis not present

## 2018-01-18 DIAGNOSIS — J3081 Allergic rhinitis due to animal (cat) (dog) hair and dander: Secondary | ICD-10-CM | POA: Diagnosis not present

## 2018-01-18 DIAGNOSIS — J301 Allergic rhinitis due to pollen: Secondary | ICD-10-CM | POA: Diagnosis not present

## 2018-01-25 DIAGNOSIS — J3081 Allergic rhinitis due to animal (cat) (dog) hair and dander: Secondary | ICD-10-CM | POA: Diagnosis not present

## 2018-01-25 DIAGNOSIS — J3089 Other allergic rhinitis: Secondary | ICD-10-CM | POA: Diagnosis not present

## 2018-01-25 DIAGNOSIS — J301 Allergic rhinitis due to pollen: Secondary | ICD-10-CM | POA: Diagnosis not present

## 2018-02-01 DIAGNOSIS — J3089 Other allergic rhinitis: Secondary | ICD-10-CM | POA: Diagnosis not present

## 2018-02-01 DIAGNOSIS — J3081 Allergic rhinitis due to animal (cat) (dog) hair and dander: Secondary | ICD-10-CM | POA: Diagnosis not present

## 2018-02-01 DIAGNOSIS — J301 Allergic rhinitis due to pollen: Secondary | ICD-10-CM | POA: Diagnosis not present

## 2018-02-14 DIAGNOSIS — J301 Allergic rhinitis due to pollen: Secondary | ICD-10-CM | POA: Diagnosis not present

## 2018-02-14 DIAGNOSIS — J3089 Other allergic rhinitis: Secondary | ICD-10-CM | POA: Diagnosis not present

## 2018-02-14 DIAGNOSIS — J3081 Allergic rhinitis due to animal (cat) (dog) hair and dander: Secondary | ICD-10-CM | POA: Diagnosis not present

## 2018-02-19 DIAGNOSIS — J3089 Other allergic rhinitis: Secondary | ICD-10-CM | POA: Diagnosis not present

## 2018-02-19 DIAGNOSIS — J3081 Allergic rhinitis due to animal (cat) (dog) hair and dander: Secondary | ICD-10-CM | POA: Diagnosis not present

## 2018-02-19 DIAGNOSIS — J301 Allergic rhinitis due to pollen: Secondary | ICD-10-CM | POA: Diagnosis not present

## 2018-03-01 DIAGNOSIS — J3081 Allergic rhinitis due to animal (cat) (dog) hair and dander: Secondary | ICD-10-CM | POA: Diagnosis not present

## 2018-03-01 DIAGNOSIS — J301 Allergic rhinitis due to pollen: Secondary | ICD-10-CM | POA: Diagnosis not present

## 2018-03-01 DIAGNOSIS — J3089 Other allergic rhinitis: Secondary | ICD-10-CM | POA: Diagnosis not present

## 2018-03-15 DIAGNOSIS — J3089 Other allergic rhinitis: Secondary | ICD-10-CM | POA: Diagnosis not present

## 2018-03-15 DIAGNOSIS — J301 Allergic rhinitis due to pollen: Secondary | ICD-10-CM | POA: Diagnosis not present

## 2018-03-15 DIAGNOSIS — J3081 Allergic rhinitis due to animal (cat) (dog) hair and dander: Secondary | ICD-10-CM | POA: Diagnosis not present

## 2018-03-20 DIAGNOSIS — L501 Idiopathic urticaria: Secondary | ICD-10-CM | POA: Diagnosis not present

## 2018-03-20 DIAGNOSIS — J301 Allergic rhinitis due to pollen: Secondary | ICD-10-CM | POA: Diagnosis not present

## 2018-03-20 DIAGNOSIS — J3089 Other allergic rhinitis: Secondary | ICD-10-CM | POA: Diagnosis not present

## 2018-03-20 DIAGNOSIS — L503 Dermatographic urticaria: Secondary | ICD-10-CM | POA: Diagnosis not present

## 2018-03-20 DIAGNOSIS — J3081 Allergic rhinitis due to animal (cat) (dog) hair and dander: Secondary | ICD-10-CM | POA: Diagnosis not present

## 2018-04-02 DIAGNOSIS — J3089 Other allergic rhinitis: Secondary | ICD-10-CM | POA: Diagnosis not present

## 2018-04-02 DIAGNOSIS — J301 Allergic rhinitis due to pollen: Secondary | ICD-10-CM | POA: Diagnosis not present

## 2018-04-02 DIAGNOSIS — J3081 Allergic rhinitis due to animal (cat) (dog) hair and dander: Secondary | ICD-10-CM | POA: Diagnosis not present

## 2018-04-19 DIAGNOSIS — J301 Allergic rhinitis due to pollen: Secondary | ICD-10-CM | POA: Diagnosis not present

## 2018-04-19 DIAGNOSIS — J3089 Other allergic rhinitis: Secondary | ICD-10-CM | POA: Diagnosis not present

## 2018-04-19 DIAGNOSIS — J3081 Allergic rhinitis due to animal (cat) (dog) hair and dander: Secondary | ICD-10-CM | POA: Diagnosis not present

## 2018-04-23 DIAGNOSIS — Z23 Encounter for immunization: Secondary | ICD-10-CM | POA: Diagnosis not present

## 2018-05-01 DIAGNOSIS — L821 Other seborrheic keratosis: Secondary | ICD-10-CM | POA: Diagnosis not present

## 2018-05-01 DIAGNOSIS — Z86018 Personal history of other benign neoplasm: Secondary | ICD-10-CM | POA: Diagnosis not present

## 2018-05-01 DIAGNOSIS — J301 Allergic rhinitis due to pollen: Secondary | ICD-10-CM | POA: Diagnosis not present

## 2018-05-01 DIAGNOSIS — J3089 Other allergic rhinitis: Secondary | ICD-10-CM | POA: Diagnosis not present

## 2018-05-01 DIAGNOSIS — L814 Other melanin hyperpigmentation: Secondary | ICD-10-CM | POA: Diagnosis not present

## 2018-05-01 DIAGNOSIS — D225 Melanocytic nevi of trunk: Secondary | ICD-10-CM | POA: Diagnosis not present

## 2018-05-01 DIAGNOSIS — J3081 Allergic rhinitis due to animal (cat) (dog) hair and dander: Secondary | ICD-10-CM | POA: Diagnosis not present

## 2018-05-10 DIAGNOSIS — J3089 Other allergic rhinitis: Secondary | ICD-10-CM | POA: Diagnosis not present

## 2018-05-10 DIAGNOSIS — J3081 Allergic rhinitis due to animal (cat) (dog) hair and dander: Secondary | ICD-10-CM | POA: Diagnosis not present

## 2018-05-10 DIAGNOSIS — J301 Allergic rhinitis due to pollen: Secondary | ICD-10-CM | POA: Diagnosis not present

## 2018-05-17 DIAGNOSIS — J301 Allergic rhinitis due to pollen: Secondary | ICD-10-CM | POA: Diagnosis not present

## 2018-05-17 DIAGNOSIS — J3081 Allergic rhinitis due to animal (cat) (dog) hair and dander: Secondary | ICD-10-CM | POA: Diagnosis not present

## 2018-05-17 DIAGNOSIS — J3089 Other allergic rhinitis: Secondary | ICD-10-CM | POA: Diagnosis not present

## 2018-05-20 DIAGNOSIS — J3089 Other allergic rhinitis: Secondary | ICD-10-CM | POA: Diagnosis not present

## 2018-05-30 DIAGNOSIS — J301 Allergic rhinitis due to pollen: Secondary | ICD-10-CM | POA: Diagnosis not present

## 2018-05-30 DIAGNOSIS — J3081 Allergic rhinitis due to animal (cat) (dog) hair and dander: Secondary | ICD-10-CM | POA: Diagnosis not present

## 2018-05-30 DIAGNOSIS — J3089 Other allergic rhinitis: Secondary | ICD-10-CM | POA: Diagnosis not present

## 2018-06-03 ENCOUNTER — Telehealth: Payer: Self-pay | Admitting: Vascular Surgery

## 2018-06-03 NOTE — Telephone Encounter (Signed)
sch appt lvm mld ltr 08/14/2018 1pm RLE reflux 2pm New vv MD

## 2018-06-03 NOTE — Telephone Encounter (Signed)
-----   Message from Micki RileyElisabeth Wert, RN sent at 05/30/2018  1:23 PM EST ----- She needs a reflux study on her r leg and then to see dickson.

## 2018-06-04 DIAGNOSIS — J301 Allergic rhinitis due to pollen: Secondary | ICD-10-CM | POA: Diagnosis not present

## 2018-06-04 DIAGNOSIS — J3081 Allergic rhinitis due to animal (cat) (dog) hair and dander: Secondary | ICD-10-CM | POA: Diagnosis not present

## 2018-06-04 DIAGNOSIS — J3089 Other allergic rhinitis: Secondary | ICD-10-CM | POA: Diagnosis not present

## 2018-06-05 ENCOUNTER — Ambulatory Visit: Payer: BLUE CROSS/BLUE SHIELD | Admitting: Psychiatry

## 2018-06-05 ENCOUNTER — Encounter: Payer: Self-pay | Admitting: Psychiatry

## 2018-06-05 DIAGNOSIS — F411 Generalized anxiety disorder: Secondary | ICD-10-CM

## 2018-06-05 MED ORDER — LORAZEPAM 0.5 MG PO TABS: 0.5000 mg | ORAL_TABLET | Freq: Three times a day (TID) | ORAL | 0 refills | Status: AC

## 2018-06-05 MED ORDER — FLUOXETINE HCL 10 MG PO CAPS: 10.0000 mg | ORAL_CAPSULE | Freq: Every day | ORAL | 1 refills | Status: AC

## 2018-06-05 NOTE — Progress Notes (Signed)
Erica Ray [id number] [birth year] 57 y.o.  Subjective:   Patient ID:  Erica Ray is a 57 y.o. (DOB 25-Nov-1960) female.  Chief Complaint:  Chief Complaint  Patient presents with  . Stress    D psych px  . Anxiety    HPI Erica Ray presents to the office today for follow-up of anxiety. [name] in [facility] on medical leave from  [place].  103 #. In tx for OCD, Depression, and EDO.  In tx facility now; left a week ago.  Very angry and resentful.  Some disagreement in past over food issues between she and her husband.    Wonders if she should take something to hlep handle the stress.  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  Some disagreement with husbaand complicates this.    Ambivalent over restarting meds.    Review of Systems:  Review of Systems  Neurological: Negative for tremors and weakness.  Psychiatric/Behavioral: Negative for agitation, behavioral problems, confusion, decreased concentration, dysphoric mood, hallucinations, self-injury, sleep disturbance and suicidal ideas. The patient is nervous/anxious. The patient is not hyperactive.   Anxious re: D's problmes.   Medications: I have reviewed the patient's current medications.  Current Outpatient Medications  Medication Sig Dispense Refill  . cetirizine (ZYRTEC) 10 MG tablet Take 10 mg by mouth as needed for allergies.    [name] MANGANESE ASPARTATE PO Take 500 mg by mouth daily.    [name] FLUoxetine (PROZAC) 10 MG capsule Take 10 mg by mouth daily.    [name] FLUoxetine (PROZAC) 10 MG capsule Take 1 capsule (10 mg total) by mouth daily. 90 capsule 1  . LORazepam (ATIVAN) 0.5 MG tablet Take 1-2 tablets (0.5-1 mg total) by mouth every 8 (eight) hours. 30 tablet 0  . mometasone (NASONEX) 50 MCG/ACT nasal spray Place 2 sprays into the nose as needed.    . Multiple Vitamins-Minerals (MULTIVITAMIN WITH MINERALS) tablet Take 1 tablet by mouth daily.    . Omega-3 Fatty Acids (FISH OIL BURP-LESS PO) Take 1,500 mg by mouth.    . Riboflavin (VITAMIN B-2 PO)  Take 200 mg by mouth.     No current facility-administered medications for this visit.     Medication Side Effects: None  Allergies:  Allergies  Allergen Reactions  . Bactrim [Sulfamethoxazole-Trimethoprim]     hives  . Penicillins     hives    Past Medical History:  Diagnosis Date  . Allergy   . Varicose veins     No family history on file.  Social History   Socioeconomic History  . Marital status: Married    Spouse name: Not on file  . Number of children: Not on file  . Years of education: Not on file  . Highest education level: Not on file  Occupational History  . Not on file  Social Needs  . Financial resource strain: Not on file  . Food insecurity:    Worry: Not on file    Inability: Not on file  . Transportation needs:    Medical: Not on file    Non-medical: Not on file  Tobacco Use  . Smoking status: Never Smoker  Substance and Sexual Activity  . Alcohol use: No  . Drug use: No  . Sexual activity: Not on file  Lifestyle  . Physical activity:    Days per week: Not on file    Minutes per session: Not on file  . Stress: Not on file  Relationships  . Social connections:  Talks on phone: Not on file    Gets together: Not on file    Attends religious service: Not on file    Active member of club or organization: Not on file    Attends meetings of clubs or organizations: Not on file    Relationship status: Not on file  . Intimate partner violence:    Fear of current or ex partner: Not on file    Emotionally abused: Not on file    Physically abused: Not on file    Forced sexual activity: Not on file  Other Topics Concern  . Not on file  Social History Narrative  . Not on file    Past Medical History, Surgical history, Social history, and Family history were reviewed and updated as appropriate.   Please see review of systems for further details on the patient's review from today.   Objective:   Physical Exam:  There were no vitals taken  for this visit.  Physical Exam  Constitutional: She is oriented to person, place, and time. She appears well-developed. No distress.  Musculoskeletal: She exhibits no deformity.  Neurological: She is alert and oriented to person, place, and time. She displays no tremor. Coordination and gait normal.  Psychiatric: Her speech is normal and behavior is normal. Judgment and thought content normal. Her mood appears anxious. Her affect is not angry, not blunt, not labile and not inappropriate. Cognition and memory are normal. She does not exhibit a depressed mood. She expresses no homicidal and no suicidal ideation. She expresses no suicidal plans and no homicidal plans.  Insight intact. No auditory or visual hallucinations. No delusions.  Sadness and anxiety over her D's psych problems. She is attentive.    Lab Review:  No results found for: NA, K, CL, CO2, GLUCOSE, BUN, CREATININE, CALCIUM, PROT, ALBUMIN, AST, ALT, ALKPHOS, BILITOT, GFRNONAA, GFRAA     Component Value Date/Time   HGB 13.7 06/25/2007 1327    No results found for: POCLITH, LITHIUM   No results found for: PHENYTOIN, PHENOBARB, VALPROATE, CBMZ   .res Assessment: Plan:    Generalized anxiety disorder  Multiple stressors disc and the various ways to approach the worsening anxiety prn Bz or restart fluoxetine.  Supportive therapy for the stressors.  Restart fluoxetine 10 q D.  Lorazepam 0.5 mg prn  Supportive therapy. .40 mins  FU 3-4 mos  Meredith Staggersarey Cottle, MD, DFAPA    Please see After Visit Summary for patient specific instructions.  Future Appointments  Date Time Provider Department Center  08/14/2018  1:00 PM [facility]  08/14/2018  2:00 PM Chuck Hintickson, Christopher S, MD [facility]    No orders of the defined types were placed in this encounter.     -------------------------------

## 2018-06-10 DIAGNOSIS — Z13820 Encounter for screening for osteoporosis: Secondary | ICD-10-CM | POA: Diagnosis not present

## 2018-06-10 DIAGNOSIS — N951 Menopausal and female climacteric states: Secondary | ICD-10-CM | POA: Diagnosis not present

## 2018-06-14 DIAGNOSIS — J301 Allergic rhinitis due to pollen: Secondary | ICD-10-CM | POA: Diagnosis not present

## 2018-06-14 DIAGNOSIS — J3089 Other allergic rhinitis: Secondary | ICD-10-CM | POA: Diagnosis not present

## 2018-06-14 DIAGNOSIS — J3081 Allergic rhinitis due to animal (cat) (dog) hair and dander: Secondary | ICD-10-CM | POA: Diagnosis not present

## 2018-06-25 DIAGNOSIS — J301 Allergic rhinitis due to pollen: Secondary | ICD-10-CM | POA: Diagnosis not present

## 2018-06-25 DIAGNOSIS — J3081 Allergic rhinitis due to animal (cat) (dog) hair and dander: Secondary | ICD-10-CM | POA: Diagnosis not present

## 2018-06-25 DIAGNOSIS — J3089 Other allergic rhinitis: Secondary | ICD-10-CM | POA: Diagnosis not present

## 2018-07-12 DIAGNOSIS — J3081 Allergic rhinitis due to animal (cat) (dog) hair and dander: Secondary | ICD-10-CM | POA: Diagnosis not present

## 2018-07-12 DIAGNOSIS — J3089 Other allergic rhinitis: Secondary | ICD-10-CM | POA: Diagnosis not present

## 2018-07-12 DIAGNOSIS — J301 Allergic rhinitis due to pollen: Secondary | ICD-10-CM | POA: Diagnosis not present

## 2018-07-17 DIAGNOSIS — J301 Allergic rhinitis due to pollen: Secondary | ICD-10-CM | POA: Diagnosis not present

## 2018-07-17 DIAGNOSIS — J3089 Other allergic rhinitis: Secondary | ICD-10-CM | POA: Diagnosis not present

## 2018-07-17 DIAGNOSIS — J3081 Allergic rhinitis due to animal (cat) (dog) hair and dander: Secondary | ICD-10-CM | POA: Diagnosis not present

## 2018-07-19 ENCOUNTER — Other Ambulatory Visit: Payer: Self-pay | Admitting: Vascular Surgery

## 2018-07-19 DIAGNOSIS — I781 Nevus, non-neoplastic: Secondary | ICD-10-CM

## 2018-07-24 DIAGNOSIS — J301 Allergic rhinitis due to pollen: Secondary | ICD-10-CM | POA: Diagnosis not present

## 2018-07-24 DIAGNOSIS — J3081 Allergic rhinitis due to animal (cat) (dog) hair and dander: Secondary | ICD-10-CM | POA: Diagnosis not present

## 2018-07-24 DIAGNOSIS — J3089 Other allergic rhinitis: Secondary | ICD-10-CM | POA: Diagnosis not present

## 2018-08-02 DIAGNOSIS — J3089 Other allergic rhinitis: Secondary | ICD-10-CM | POA: Diagnosis not present

## 2018-08-02 DIAGNOSIS — J3081 Allergic rhinitis due to animal (cat) (dog) hair and dander: Secondary | ICD-10-CM | POA: Diagnosis not present

## 2018-08-02 DIAGNOSIS — J301 Allergic rhinitis due to pollen: Secondary | ICD-10-CM | POA: Diagnosis not present

## 2018-08-14 ENCOUNTER — Ambulatory Visit (INDEPENDENT_AMBULATORY_CARE_PROVIDER_SITE_OTHER): Payer: Self-pay | Admitting: Vascular Surgery

## 2018-08-14 ENCOUNTER — Encounter: Payer: Self-pay | Admitting: Vascular Surgery

## 2018-08-14 ENCOUNTER — Ambulatory Visit (HOSPITAL_COMMUNITY)
Admission: RE | Admit: 2018-08-14 | Discharge: 2018-08-14 | Disposition: A | Discharge status: Home / Self Care | Payer: BLUE CROSS/BLUE SHIELD | Source: Ambulatory Visit | Attending: Vascular Surgery | Admitting: Vascular Surgery

## 2018-08-14 ENCOUNTER — Other Ambulatory Visit: Payer: Self-pay

## 2018-08-14 VITALS — BP 110/71 | HR 68 | Temp 97.7°F | Resp 16

## 2018-08-14 DIAGNOSIS — Z681 Body mass index (BMI) 19 or less, adult: Secondary | ICD-10-CM | POA: Diagnosis not present

## 2018-08-14 DIAGNOSIS — J301 Allergic rhinitis due to pollen: Secondary | ICD-10-CM | POA: Diagnosis not present

## 2018-08-14 DIAGNOSIS — J3089 Other allergic rhinitis: Secondary | ICD-10-CM | POA: Diagnosis not present

## 2018-08-14 DIAGNOSIS — Z01419 Encounter for gynecological examination (general) (routine) without abnormal findings: Secondary | ICD-10-CM | POA: Diagnosis not present

## 2018-08-14 DIAGNOSIS — I781 Nevus, non-neoplastic: Secondary | ICD-10-CM | POA: Diagnosis not present

## 2018-08-14 DIAGNOSIS — I872 Venous insufficiency (chronic) (peripheral): Secondary | ICD-10-CM

## 2018-08-14 DIAGNOSIS — Z1231 Encounter for screening mammogram for malignant neoplasm of breast: Secondary | ICD-10-CM | POA: Diagnosis not present

## 2018-08-14 DIAGNOSIS — J3081 Allergic rhinitis due to animal (cat) (dog) hair and dander: Secondary | ICD-10-CM | POA: Diagnosis not present

## 2018-08-14 NOTE — Progress Notes (Signed)
Patient name: Erica Ray MRN: [id number] DOB: 07/31/1960 Sex: female  REASON FOR VISIT:   Follow-up of varicose veins  HPI:   Erica Ray is a pleasant 58 y.o. female who is previously undergone endovenous laser ablation of the right great saphenous vein by Dr. Arbie Cookey.  She recently had been noticing some increasing burning and aching pain in her right leg and felt that she was having some return of her symptoms.  She is noted some varicose veins in her posterior right calf and some spider veins on both sides.  She has no history of DVT or phlebitis.  She experiences aching and heaviness in her legs which is aggravated by standing and relieved somewhat with elevation.  She has been wearing her 20-30 thigh-high compression stockings some but these she feels are oftentimes uncomfortable.  She takes ibuprofen as needed for pain.  She is otherwise very healthy.  She stays very active.  Past Medical History:  Diagnosis Date  . Allergy   . Varicose veins     Family History  Problem Relation Age of Onset  . Heart disease Father     SOCIAL HISTORY: Social History   Tobacco Use  . Smoking status: Never Smoker  . Smokeless tobacco: Never Used  Substance Use Topics  . Alcohol use: No    Allergies  Allergen Reactions  . Bactrim [Sulfamethoxazole-Trimethoprim]     hives  . Penicillins     hives  . Sulfa Antibiotics     Current Outpatient Medications  Medication Sig Dispense Refill  . cetirizine (ZYRTEC) 10 MG tablet Take 10 mg by mouth as needed for allergies.    [name] FLUoxetine (PROZAC) 10 MG capsule Take 10 mg by mouth daily.    [name] FLUoxetine (PROZAC) 10 MG capsule Take 1 capsule (10 mg total) by mouth daily. 90 capsule 1  . LORazepam (ATIVAN) 0.5 MG tablet Take 1-2 tablets (0.5-1 mg total) by mouth every 8 (eight) hours. 30 tablet 0  . MANGANESE ASPARTATE PO Take 500 mg by mouth daily.    . mometasone (NASONEX) 50 MCG/ACT nasal spray Place 2 sprays into the nose as needed.      . Multiple Vitamins-Minerals (MULTIVITAMIN WITH MINERALS) tablet Take 1 tablet by mouth daily.    . Omega-3 Fatty Acids (FISH OIL BURP-LESS PO) Take 1,500 mg by mouth.    . Riboflavin (VITAMIN B-2 PO) Take 200 mg by mouth.     No current facility-administered medications for this visit.     REVIEW OF SYSTEMS:  [X]  denotes positive finding, [ ]  denotes negative finding Cardiac  Comments:  Chest pain or chest pressure:    Shortness of breath upon exertion:    Short of breath when lying flat:    Irregular heart rhythm:        Vascular    Pain in calf, thigh, or hip brought on by ambulation:    Pain in feet at night that wakes you up from your sleep:     Blood clot in your veins:    Leg swelling:         Pulmonary    Oxygen at home:    Productive cough:     Wheezing:         Neurologic    Sudden weakness in arms or legs:     Sudden numbness in arms or legs:     Sudden onset of difficulty speaking or slurred speech:    Temporary loss of vision in  one eye:     Problems with dizziness:         Gastrointestinal    Blood in stool:     Vomited blood:         Genitourinary    Burning when urinating:     Blood in urine:        Psychiatric    Major depression:         Hematologic    Bleeding problems:    Problems with blood clotting too easily:        Skin    Rashes or ulcers:        Constitutional    Fever or chills:     PHYSICAL EXAM:   Vitals:   08/14/18 1415  BP: 110/71  Pulse: 68  Resp: 16  Temp: 97.7 F (36.5 C)  TempSrc: Oral  SpO2: 100%    GENERAL: The patient is a well-nourished female, in no acute distress. The vital signs are documented above. CARDIAC: There is a regular rate and rhythm.  VASCULAR: I do not detect carotid bruits. She has palpable pedal pulses bilaterally. VENOUS: She does have a cluster of varicose veins in her posterior right calf.  She has reflux in her right small saphenous vein but the vein is not especially dilated.  She has  some spider veins bilaterally.  She has no significant leg swelling currently no significant hyperpigmentation. PULMONARY: There is good air exchange bilaterally without wheezing or rales. ABDOMEN: Soft and non-tender with normal pitched bowel sounds.  MUSCULOSKELETAL: There are no major deformities or cyanosis. NEUROLOGIC: No focal weakness or paresthesias are detected. SKIN: There are no ulcers or rashes noted. PSYCHIATRIC: The patient has a normal affect.  DATA:    VENOUS DUPLEX: I have independently interpreted her venous duplex scan today.  On the right side, there is no evidence of DVT or superficial thrombophlebitis.  There is deep venous reflux involving the common femoral vein, femoral vein, and popliteal vein.  There is superficial venous reflux involving the great saphenous vein from the knee to the mid thigh but the vein is not especially dilated.  The maximum diameter is 0.46 cm.  There is some reflux in the proximal and mid small saphenous vein but again this vein is not significantly dilated.  MEDICAL ISSUES:   CHRONIC VENOUS INSUFFICIENCY: This patient has deep venous reflux on the right involving the common femoral vein femoral vein and popliteal vein.  She does have some reflux in the distal and mid right great saphenous vein but the vein is not dilated.  Likewise she has a small amount of reflux in the small saphenous vein but this vein to was not dilated.  I explained that she is not really a candidate for laser ablation given that the veins are small and technically this would not be easy to do.  I have discussed with her conservative treatment including the importance of intermittent leg elevation and the proper positioning for this.  I encouraged her to wear a knee-high stocking with a mild gradient which I think would be more practical for her.  I encouraged her to avoid prolonged sitting and standing.  She should continue to exercise we also discussed water aerobics which I  think is very helpful for patients with venous insufficiency.  If her varicose veins and symptoms progress in the future we can repeat her study to see if the reflux in the small saphenous vein has progressed.  I explained that in the  future she might potentially be a candidate for laser ablation of the small saphenous vein.  I will see her back at any time if her symptoms progress.  Waverly Ferrari [facility] [phone]

## 2018-08-16 DIAGNOSIS — J301 Allergic rhinitis due to pollen: Secondary | ICD-10-CM | POA: Diagnosis not present

## 2018-08-16 DIAGNOSIS — J3089 Other allergic rhinitis: Secondary | ICD-10-CM | POA: Diagnosis not present

## 2018-08-16 DIAGNOSIS — J3081 Allergic rhinitis due to animal (cat) (dog) hair and dander: Secondary | ICD-10-CM | POA: Diagnosis not present

## 2018-08-23 DIAGNOSIS — J3089 Other allergic rhinitis: Secondary | ICD-10-CM | POA: Diagnosis not present

## 2018-08-23 DIAGNOSIS — J3081 Allergic rhinitis due to animal (cat) (dog) hair and dander: Secondary | ICD-10-CM | POA: Diagnosis not present

## 2018-08-23 DIAGNOSIS — J301 Allergic rhinitis due to pollen: Secondary | ICD-10-CM | POA: Diagnosis not present

## 2018-09-02 ENCOUNTER — Ambulatory Visit: Payer: BLUE CROSS/BLUE SHIELD | Admitting: Psychiatry

## 2018-09-02 DIAGNOSIS — J3089 Other allergic rhinitis: Secondary | ICD-10-CM | POA: Diagnosis not present

## 2018-09-02 DIAGNOSIS — J3081 Allergic rhinitis due to animal (cat) (dog) hair and dander: Secondary | ICD-10-CM | POA: Diagnosis not present

## 2018-09-02 DIAGNOSIS — J301 Allergic rhinitis due to pollen: Secondary | ICD-10-CM | POA: Diagnosis not present

## 2018-09-11 DIAGNOSIS — J301 Allergic rhinitis due to pollen: Secondary | ICD-10-CM | POA: Diagnosis not present

## 2018-09-11 DIAGNOSIS — J3081 Allergic rhinitis due to animal (cat) (dog) hair and dander: Secondary | ICD-10-CM | POA: Diagnosis not present

## 2018-09-11 DIAGNOSIS — J3089 Other allergic rhinitis: Secondary | ICD-10-CM | POA: Diagnosis not present

## 2018-09-20 DIAGNOSIS — J3089 Other allergic rhinitis: Secondary | ICD-10-CM | POA: Diagnosis not present

## 2018-09-20 DIAGNOSIS — J3081 Allergic rhinitis due to animal (cat) (dog) hair and dander: Secondary | ICD-10-CM | POA: Diagnosis not present

## 2018-09-20 DIAGNOSIS — J301 Allergic rhinitis due to pollen: Secondary | ICD-10-CM | POA: Diagnosis not present

## 2018-10-03 DIAGNOSIS — H1131 Conjunctival hemorrhage, right eye: Secondary | ICD-10-CM | POA: Diagnosis not present

## 2018-10-08 ENCOUNTER — Encounter: Payer: Self-pay | Admitting: Psychiatry

## 2018-10-08 ENCOUNTER — Ambulatory Visit: Payer: BLUE CROSS/BLUE SHIELD | Admitting: Psychiatry

## 2018-10-08 ENCOUNTER — Ambulatory Visit (INDEPENDENT_AMBULATORY_CARE_PROVIDER_SITE_OTHER): Payer: BLUE CROSS/BLUE SHIELD | Admitting: Psychiatry

## 2018-10-08 DIAGNOSIS — F4322 Adjustment disorder with anxiety: Secondary | ICD-10-CM | POA: Diagnosis not present

## 2018-10-08 DIAGNOSIS — F411 Generalized anxiety disorder: Secondary | ICD-10-CM

## 2018-10-08 NOTE — Progress Notes (Signed)
Erica Ray [id number] January 10, 1961 58 y.o.  Subjective:   Patient ID:  Erica Ray is a 58 y.o. (DOB 07-06-1961) female.  Chief Complaint:  Chief Complaint  Patient presents with  . Anxiety  . Follow-up    med change    HPI Erica Ray presents today for follow-up of anxiety.   She was last seen June 05, 2018 and resumed fluoxetine 10 mg daily for anxiety and stress symptoms.  Decided not to stay on psych meds for spiritual reasons and family reasons.  Continue difficulty with kids.  Things are really hard.  Trauma from childhood worsens this.  Erica Ray in [facility] on medical leave from  [employer] [place].  103 #. In tx for OCD, Depression, and EDO. Very angry and resentful.  Some disagreement in past over food issues between she and her husband.    Wonders if she should take something to hlep handle the stress.  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  Some disagreement with husbaand complicates this.   Some concerns about meds with the kids.  Wants me to consult on the kids.  Ambivalent over restarting meds.    Always struggled with weight and didn't want to gain.    Review of Systems:  Review of Systems  Neurological: Negative for tremors and weakness.  Psychiatric/Behavioral: Negative for agitation, behavioral problems, confusion, decreased concentration, dysphoric mood, hallucinations, self-injury, sleep disturbance and suicidal ideas. The patient is nervous/anxious. The patient is not hyperactive.   Anxious re: D's problmes and family.   Medications: I have reviewed the patient's current medications.  Current Outpatient Medications  Medication Sig Dispense Refill  . cetirizine (ZYRTEC) 10 MG tablet Take 10 mg by mouth as needed for allergies.    [name] MANGANESE ASPARTATE PO Take 500 mg by mouth daily.    . mometasone (NASONEX) 50 MCG/ACT nasal spray Place 2 sprays into the nose as needed.    . Multiple Vitamins-Minerals (MULTIVITAMIN WITH MINERALS) tablet Take 1 tablet by mouth daily.    .  Omega-3 Fatty Acids (FISH OIL BURP-LESS PO) Take 1,500 mg by mouth.    . Riboflavin (VITAMIN B-2 PO) Take 200 mg by mouth.    [name] FLUoxetine (PROZAC) 10 MG capsule Take 1 capsule (10 mg total) by mouth daily. (Patient not taking: Reported on 10/08/2018) 90 capsule 1  . LORazepam (ATIVAN) 0.5 MG tablet Take 1-2 tablets (0.5-1 mg total) by mouth every 8 (eight) hours. (Patient not taking: Reported on 10/08/2018) 30 tablet 0   No current facility-administered medications for this visit.     Medication Side Effects: None  Allergies:  Allergies  Allergen Reactions  . Bactrim [Sulfamethoxazole-Trimethoprim]     hives  . Penicillins     hives  . Sulfa Antibiotics     Past Medical History:  Diagnosis Date  . Allergy   . Varicose veins     Family History  Problem Relation Age of Onset  . Heart disease Father     Social History   Socioeconomic History  . Marital status: Married    Spouse name: Not on file  . Number of children: Not on file  . Years of education: Not on file  . Highest education level: Not on file  Occupational History  . Not on file  Social Needs  . Financial resource strain: Not on file  . Food insecurity:    Worry: Not on file    Inability: Not on file  . Transportation needs:    Medical:  Not on file    Non-medical: Not on file  Tobacco Use  . Smoking status: Never Smoker  . Smokeless tobacco: Never Used  Substance and Sexual Activity  . Alcohol use: No  . Drug use: No  . Sexual activity: Not on file  Lifestyle  . Physical activity:    Days per week: Not on file    Minutes per session: Not on file  . Stress: Not on file  Relationships  . Social connections:    Talks on phone: Not on file    Gets together: Not on file    Attends religious service: Not on file    Active member of club or organization: Not on file    Attends meetings of clubs or organizations: Not on file    Relationship status: Not on file  . Intimate partner violence:    Fear  of current or ex partner: Not on file    Emotionally abused: Not on file    Physically abused: Not on file    Forced sexual activity: Not on file  Other Topics Concern  . Not on file  Social History Narrative  . Not on file    Past Medical History, Surgical history, Social history, and Family history were reviewed and updated as appropriate.   Please see review of systems for further details on the patient's review from today.   Objective:   Physical Exam:  There were no vitals taken for this visit.  Physical Exam Neurological:     Mental Status: She is alert and oriented to person, place, and time.     Cranial Nerves: No dysarthria.  Psychiatric:        Attention and Perception: Attention normal.        Mood and Affect: Mood is anxious.        Speech: Speech normal.        Behavior: Behavior is cooperative.        Thought Content: Thought content normal. Thought content is not paranoid or delusional. Thought content does not include homicidal or suicidal ideation. Thought content does not include homicidal or suicidal plan.        Cognition and Memory: Cognition and memory normal.        Judgment: Judgment normal.     Comments: Preoccupied with family and especially kids.     Lab Review:  No results found for: NA, K, CL, CO2, GLUCOSE, BUN, CREATININE, CALCIUM, PROT, ALBUMIN, AST, ALT, ALKPHOS, BILITOT, GFRNONAA, GFRAA     Component Value Date/Time   HGB 13.7 06/25/2007 1327    No results found for: POCLITH, LITHIUM   No results found for: PHENYTOIN, PHENOBARB, VALPROATE, CBMZ   .res Assessment: Plan:    Generalized anxiety disorder  Adjustment disorder with anxious mood  Multiple stressors disc and the various ways to approach the worsening anxiety prn Bz or restart fluoxetine.  Supportive therapy for the stressors and would be happy to consult on the teens meds.  Restart fluoxetine 10 q D.  She'll reconsider to use at certain times.  Disc the duration needed  to work.  Encourage her try it again and judge it's effects.  Not taking Lorazepam 0.5 mg prn  Supportive therapy. .36 mins  She wants to continue to see me periodically.  Option buspirone.  She'd prefer not and to take Prozac if needed.  FU 6 mos  I connected with patient by a video enabled telemedicine application or telephone, with their informed consent, and verified  patient privacy and that I am speaking with the correct person using two identifiers.  I was located at office and patient at home.  Meredith Staggersarey Cottle, MD, DFAPA    Please see After Visit Summary for patient specific instructions.  No future appointments.  No orders of the defined types were placed in this encounter.     -------------------------------

## 2018-12-24 DIAGNOSIS — J3081 Allergic rhinitis due to animal (cat) (dog) hair and dander: Secondary | ICD-10-CM | POA: Diagnosis not present

## 2018-12-24 DIAGNOSIS — J301 Allergic rhinitis due to pollen: Secondary | ICD-10-CM | POA: Diagnosis not present

## 2018-12-24 DIAGNOSIS — J3089 Other allergic rhinitis: Secondary | ICD-10-CM | POA: Diagnosis not present

## 2018-12-27 DIAGNOSIS — J3081 Allergic rhinitis due to animal (cat) (dog) hair and dander: Secondary | ICD-10-CM | POA: Diagnosis not present

## 2018-12-27 DIAGNOSIS — J301 Allergic rhinitis due to pollen: Secondary | ICD-10-CM | POA: Diagnosis not present

## 2018-12-27 DIAGNOSIS — J3089 Other allergic rhinitis: Secondary | ICD-10-CM | POA: Diagnosis not present

## 2019-01-03 DIAGNOSIS — J301 Allergic rhinitis due to pollen: Secondary | ICD-10-CM | POA: Diagnosis not present

## 2019-01-03 DIAGNOSIS — J3089 Other allergic rhinitis: Secondary | ICD-10-CM | POA: Diagnosis not present

## 2019-01-03 DIAGNOSIS — J3081 Allergic rhinitis due to animal (cat) (dog) hair and dander: Secondary | ICD-10-CM | POA: Diagnosis not present

## 2019-01-08 DIAGNOSIS — J3089 Other allergic rhinitis: Secondary | ICD-10-CM | POA: Diagnosis not present

## 2019-01-08 DIAGNOSIS — J3081 Allergic rhinitis due to animal (cat) (dog) hair and dander: Secondary | ICD-10-CM | POA: Diagnosis not present

## 2019-01-08 DIAGNOSIS — J301 Allergic rhinitis due to pollen: Secondary | ICD-10-CM | POA: Diagnosis not present

## 2019-01-15 DIAGNOSIS — J3089 Other allergic rhinitis: Secondary | ICD-10-CM | POA: Diagnosis not present

## 2019-01-15 DIAGNOSIS — J301 Allergic rhinitis due to pollen: Secondary | ICD-10-CM | POA: Diagnosis not present

## 2019-01-15 DIAGNOSIS — J3081 Allergic rhinitis due to animal (cat) (dog) hair and dander: Secondary | ICD-10-CM | POA: Diagnosis not present

## 2019-01-17 DIAGNOSIS — J301 Allergic rhinitis due to pollen: Secondary | ICD-10-CM | POA: Diagnosis not present

## 2019-01-17 DIAGNOSIS — J3089 Other allergic rhinitis: Secondary | ICD-10-CM | POA: Diagnosis not present

## 2019-01-17 DIAGNOSIS — J3081 Allergic rhinitis due to animal (cat) (dog) hair and dander: Secondary | ICD-10-CM | POA: Diagnosis not present

## 2019-01-22 DIAGNOSIS — J301 Allergic rhinitis due to pollen: Secondary | ICD-10-CM | POA: Diagnosis not present

## 2019-01-22 DIAGNOSIS — J3089 Other allergic rhinitis: Secondary | ICD-10-CM | POA: Diagnosis not present

## 2019-01-22 DIAGNOSIS — J3081 Allergic rhinitis due to animal (cat) (dog) hair and dander: Secondary | ICD-10-CM | POA: Diagnosis not present

## 2019-01-31 DIAGNOSIS — J3081 Allergic rhinitis due to animal (cat) (dog) hair and dander: Secondary | ICD-10-CM | POA: Diagnosis not present

## 2019-01-31 DIAGNOSIS — J301 Allergic rhinitis due to pollen: Secondary | ICD-10-CM | POA: Diagnosis not present

## 2019-01-31 DIAGNOSIS — J3089 Other allergic rhinitis: Secondary | ICD-10-CM | POA: Diagnosis not present

## 2019-02-04 DIAGNOSIS — J301 Allergic rhinitis due to pollen: Secondary | ICD-10-CM | POA: Diagnosis not present

## 2019-02-04 DIAGNOSIS — J3081 Allergic rhinitis due to animal (cat) (dog) hair and dander: Secondary | ICD-10-CM | POA: Diagnosis not present

## 2019-02-04 DIAGNOSIS — J3089 Other allergic rhinitis: Secondary | ICD-10-CM | POA: Diagnosis not present

## 2019-02-07 DIAGNOSIS — J3089 Other allergic rhinitis: Secondary | ICD-10-CM | POA: Diagnosis not present

## 2019-02-07 DIAGNOSIS — J3081 Allergic rhinitis due to animal (cat) (dog) hair and dander: Secondary | ICD-10-CM | POA: Diagnosis not present

## 2019-02-07 DIAGNOSIS — J301 Allergic rhinitis due to pollen: Secondary | ICD-10-CM | POA: Diagnosis not present

## 2019-02-14 DIAGNOSIS — J3089 Other allergic rhinitis: Secondary | ICD-10-CM | POA: Diagnosis not present

## 2019-02-14 DIAGNOSIS — J3081 Allergic rhinitis due to animal (cat) (dog) hair and dander: Secondary | ICD-10-CM | POA: Diagnosis not present

## 2019-02-14 DIAGNOSIS — J301 Allergic rhinitis due to pollen: Secondary | ICD-10-CM | POA: Diagnosis not present

## 2019-02-20 DIAGNOSIS — J3081 Allergic rhinitis due to animal (cat) (dog) hair and dander: Secondary | ICD-10-CM | POA: Diagnosis not present

## 2019-02-20 DIAGNOSIS — J301 Allergic rhinitis due to pollen: Secondary | ICD-10-CM | POA: Diagnosis not present

## 2019-02-20 DIAGNOSIS — J3089 Other allergic rhinitis: Secondary | ICD-10-CM | POA: Diagnosis not present

## 2019-02-24 DIAGNOSIS — J301 Allergic rhinitis due to pollen: Secondary | ICD-10-CM | POA: Diagnosis not present

## 2019-02-24 DIAGNOSIS — J3089 Other allergic rhinitis: Secondary | ICD-10-CM | POA: Diagnosis not present

## 2019-02-24 DIAGNOSIS — J3081 Allergic rhinitis due to animal (cat) (dog) hair and dander: Secondary | ICD-10-CM | POA: Diagnosis not present

## 2019-03-03 DIAGNOSIS — J301 Allergic rhinitis due to pollen: Secondary | ICD-10-CM | POA: Diagnosis not present

## 2019-03-03 DIAGNOSIS — J3081 Allergic rhinitis due to animal (cat) (dog) hair and dander: Secondary | ICD-10-CM | POA: Diagnosis not present

## 2019-03-03 DIAGNOSIS — J3089 Other allergic rhinitis: Secondary | ICD-10-CM | POA: Diagnosis not present

## 2019-03-07 DIAGNOSIS — J301 Allergic rhinitis due to pollen: Secondary | ICD-10-CM | POA: Diagnosis not present

## 2019-03-07 DIAGNOSIS — J3081 Allergic rhinitis due to animal (cat) (dog) hair and dander: Secondary | ICD-10-CM | POA: Diagnosis not present

## 2019-03-07 DIAGNOSIS — J3089 Other allergic rhinitis: Secondary | ICD-10-CM | POA: Diagnosis not present

## 2019-03-12 DIAGNOSIS — J301 Allergic rhinitis due to pollen: Secondary | ICD-10-CM | POA: Diagnosis not present

## 2019-03-12 DIAGNOSIS — J3081 Allergic rhinitis due to animal (cat) (dog) hair and dander: Secondary | ICD-10-CM | POA: Diagnosis not present

## 2019-03-12 DIAGNOSIS — J3089 Other allergic rhinitis: Secondary | ICD-10-CM | POA: Diagnosis not present

## 2019-03-13 DIAGNOSIS — J3089 Other allergic rhinitis: Secondary | ICD-10-CM | POA: Diagnosis not present

## 2019-03-13 DIAGNOSIS — J301 Allergic rhinitis due to pollen: Secondary | ICD-10-CM | POA: Diagnosis not present

## 2019-03-13 DIAGNOSIS — L501 Idiopathic urticaria: Secondary | ICD-10-CM | POA: Diagnosis not present

## 2019-03-13 DIAGNOSIS — L503 Dermatographic urticaria: Secondary | ICD-10-CM | POA: Diagnosis not present

## 2019-03-18 DIAGNOSIS — J3089 Other allergic rhinitis: Secondary | ICD-10-CM | POA: Diagnosis not present

## 2019-03-18 DIAGNOSIS — J301 Allergic rhinitis due to pollen: Secondary | ICD-10-CM | POA: Diagnosis not present

## 2019-03-18 DIAGNOSIS — J3081 Allergic rhinitis due to animal (cat) (dog) hair and dander: Secondary | ICD-10-CM | POA: Diagnosis not present

## 2019-03-20 DIAGNOSIS — J3081 Allergic rhinitis due to animal (cat) (dog) hair and dander: Secondary | ICD-10-CM | POA: Diagnosis not present

## 2019-03-20 DIAGNOSIS — J301 Allergic rhinitis due to pollen: Secondary | ICD-10-CM | POA: Diagnosis not present

## 2019-03-20 DIAGNOSIS — J3089 Other allergic rhinitis: Secondary | ICD-10-CM | POA: Diagnosis not present

## 2019-03-26 DIAGNOSIS — J3081 Allergic rhinitis due to animal (cat) (dog) hair and dander: Secondary | ICD-10-CM | POA: Diagnosis not present

## 2019-03-26 DIAGNOSIS — J3089 Other allergic rhinitis: Secondary | ICD-10-CM | POA: Diagnosis not present

## 2019-03-26 DIAGNOSIS — J301 Allergic rhinitis due to pollen: Secondary | ICD-10-CM | POA: Diagnosis not present

## 2019-04-09 DIAGNOSIS — J301 Allergic rhinitis due to pollen: Secondary | ICD-10-CM | POA: Diagnosis not present

## 2019-04-09 DIAGNOSIS — J3081 Allergic rhinitis due to animal (cat) (dog) hair and dander: Secondary | ICD-10-CM | POA: Diagnosis not present

## 2019-04-09 DIAGNOSIS — J3089 Other allergic rhinitis: Secondary | ICD-10-CM | POA: Diagnosis not present

## 2019-04-16 DIAGNOSIS — J3081 Allergic rhinitis due to animal (cat) (dog) hair and dander: Secondary | ICD-10-CM | POA: Diagnosis not present

## 2019-04-16 DIAGNOSIS — J3089 Other allergic rhinitis: Secondary | ICD-10-CM | POA: Diagnosis not present

## 2019-04-16 DIAGNOSIS — J301 Allergic rhinitis due to pollen: Secondary | ICD-10-CM | POA: Diagnosis not present

## 2019-04-30 DIAGNOSIS — J301 Allergic rhinitis due to pollen: Secondary | ICD-10-CM | POA: Diagnosis not present

## 2019-04-30 DIAGNOSIS — J3081 Allergic rhinitis due to animal (cat) (dog) hair and dander: Secondary | ICD-10-CM | POA: Diagnosis not present

## 2019-04-30 DIAGNOSIS — J3089 Other allergic rhinitis: Secondary | ICD-10-CM | POA: Diagnosis not present

## 2019-05-08 DIAGNOSIS — J3081 Allergic rhinitis due to animal (cat) (dog) hair and dander: Secondary | ICD-10-CM | POA: Diagnosis not present

## 2019-05-08 DIAGNOSIS — J301 Allergic rhinitis due to pollen: Secondary | ICD-10-CM | POA: Diagnosis not present

## 2019-05-09 DIAGNOSIS — J3089 Other allergic rhinitis: Secondary | ICD-10-CM | POA: Diagnosis not present

## 2019-05-13 DIAGNOSIS — J301 Allergic rhinitis due to pollen: Secondary | ICD-10-CM | POA: Diagnosis not present

## 2019-05-13 DIAGNOSIS — J3089 Other allergic rhinitis: Secondary | ICD-10-CM | POA: Diagnosis not present

## 2019-05-13 DIAGNOSIS — J3081 Allergic rhinitis due to animal (cat) (dog) hair and dander: Secondary | ICD-10-CM | POA: Diagnosis not present

## 2019-06-12 DIAGNOSIS — J301 Allergic rhinitis due to pollen: Secondary | ICD-10-CM | POA: Diagnosis not present

## 2019-06-12 DIAGNOSIS — J3081 Allergic rhinitis due to animal (cat) (dog) hair and dander: Secondary | ICD-10-CM | POA: Diagnosis not present

## 2019-06-12 DIAGNOSIS — J3089 Other allergic rhinitis: Secondary | ICD-10-CM | POA: Diagnosis not present

## 2019-06-20 DIAGNOSIS — J301 Allergic rhinitis due to pollen: Secondary | ICD-10-CM | POA: Diagnosis not present

## 2019-06-20 DIAGNOSIS — J3081 Allergic rhinitis due to animal (cat) (dog) hair and dander: Secondary | ICD-10-CM | POA: Diagnosis not present

## 2019-06-20 DIAGNOSIS — J3089 Other allergic rhinitis: Secondary | ICD-10-CM | POA: Diagnosis not present

## 2019-07-08 DIAGNOSIS — J3089 Other allergic rhinitis: Secondary | ICD-10-CM | POA: Diagnosis not present

## 2019-07-08 DIAGNOSIS — J301 Allergic rhinitis due to pollen: Secondary | ICD-10-CM | POA: Diagnosis not present

## 2019-07-08 DIAGNOSIS — J3081 Allergic rhinitis due to animal (cat) (dog) hair and dander: Secondary | ICD-10-CM | POA: Diagnosis not present

## 2022-09-05 ENCOUNTER — Other Ambulatory Visit: Payer: Self-pay | Admitting: Family Medicine

## 2022-09-05 DIAGNOSIS — E785 Hyperlipidemia, unspecified: Secondary | ICD-10-CM

## 2022-10-03 ENCOUNTER — Other Ambulatory Visit: Payer: BLUE CROSS/BLUE SHIELD

## 2022-10-05 ENCOUNTER — Ambulatory Visit
Admission: RE | Admit: 2022-10-05 | Discharge: 2022-10-05 | Disposition: A | Discharge status: Home / Self Care | Payer: No Typology Code available for payment source | Source: Ambulatory Visit | Attending: Family Medicine | Admitting: Family Medicine

## 2022-10-05 DIAGNOSIS — E785 Hyperlipidemia, unspecified: Secondary | ICD-10-CM
